# Patient Record
Sex: Female | Born: 1935 | Race: White | Hispanic: No | Smoking: Never smoker
Health system: Southern US, Community
[De-identification: ages and names within clinical notes are randomized; demographics above are authoritative.]

## PROBLEM LIST (undated history)

## (undated) DIAGNOSIS — K5730 Diverticulosis of large intestine without perforation or abscess without bleeding: Secondary | ICD-10-CM

## (undated) DIAGNOSIS — R7303 Prediabetes: Secondary | ICD-10-CM

## (undated) DIAGNOSIS — F329 Major depressive disorder, single episode, unspecified: Secondary | ICD-10-CM

## (undated) DIAGNOSIS — I10 Essential (primary) hypertension: Secondary | ICD-10-CM

## (undated) DIAGNOSIS — R002 Palpitations: Secondary | ICD-10-CM

## (undated) DIAGNOSIS — F32A Depression, unspecified: Secondary | ICD-10-CM

## (undated) DIAGNOSIS — E785 Hyperlipidemia, unspecified: Secondary | ICD-10-CM

## (undated) DIAGNOSIS — I2510 Atherosclerotic heart disease of native coronary artery without angina pectoris: Secondary | ICD-10-CM

## (undated) DIAGNOSIS — K5900 Constipation, unspecified: Secondary | ICD-10-CM

## (undated) DIAGNOSIS — F419 Anxiety disorder, unspecified: Secondary | ICD-10-CM

## (undated) DIAGNOSIS — Z8719 Personal history of other diseases of the digestive system: Secondary | ICD-10-CM

## (undated) DIAGNOSIS — N200 Calculus of kidney: Secondary | ICD-10-CM

## (undated) HISTORY — DX: Personal history of other diseases of the digestive system: Z87.19

## (undated) HISTORY — DX: Depression, unspecified: F32.A

## (undated) HISTORY — DX: Essential (primary) hypertension: I10

## (undated) HISTORY — DX: Atherosclerotic heart disease of native coronary artery without angina pectoris: I25.10

## (undated) HISTORY — DX: Constipation, unspecified: K59.00

## (undated) HISTORY — DX: Major depressive disorder, single episode, unspecified: F32.9

## (undated) HISTORY — DX: Palpitations: R00.2

## (undated) HISTORY — DX: Anxiety disorder, unspecified: F41.9

## (undated) HISTORY — DX: Calculus of kidney: N20.0

## (undated) HISTORY — DX: Diverticulosis of large intestine without perforation or abscess without bleeding: K57.30

## (undated) HISTORY — DX: Hyperlipidemia, unspecified: E78.5

## (undated) HISTORY — DX: Prediabetes: R73.03

---

## 1990-06-20 HISTORY — PX: CARDIAC CATHETERIZATION: SHX172

## 1998-03-19 ENCOUNTER — Emergency Department (HOSPITAL_COMMUNITY): Admission: EM | Admit: 1998-03-19 | Discharge: 1998-03-20 | Payer: Self-pay | Admitting: Emergency Medicine

## 1998-03-20 ENCOUNTER — Encounter: Payer: Self-pay | Admitting: Emergency Medicine

## 1999-03-27 ENCOUNTER — Ambulatory Visit (HOSPITAL_COMMUNITY): Admission: RE | Admit: 1999-03-27 | Discharge: 1999-03-27 | Payer: Self-pay | Admitting: *Deleted

## 1999-03-27 ENCOUNTER — Encounter: Payer: Self-pay | Admitting: *Deleted

## 1999-04-02 ENCOUNTER — Encounter: Payer: Self-pay | Admitting: Neurology

## 1999-04-02 ENCOUNTER — Ambulatory Visit (HOSPITAL_COMMUNITY): Admission: RE | Admit: 1999-04-02 | Discharge: 1999-04-02 | Payer: Self-pay | Admitting: Neurology

## 1999-04-09 ENCOUNTER — Encounter: Payer: Self-pay | Admitting: Family Medicine

## 1999-06-25 ENCOUNTER — Other Ambulatory Visit: Admission: RE | Admit: 1999-06-25 | Discharge: 1999-06-25 | Payer: Self-pay | Admitting: *Deleted

## 1999-07-08 ENCOUNTER — Encounter: Payer: Self-pay | Admitting: *Deleted

## 1999-07-08 ENCOUNTER — Encounter: Admission: RE | Admit: 1999-07-08 | Discharge: 1999-07-08 | Payer: Self-pay | Admitting: Internal Medicine

## 1999-12-14 ENCOUNTER — Encounter: Payer: Self-pay | Admitting: *Deleted

## 1999-12-14 ENCOUNTER — Encounter: Admission: RE | Admit: 1999-12-14 | Discharge: 1999-12-14 | Payer: Self-pay | Admitting: *Deleted

## 1999-12-16 ENCOUNTER — Encounter: Admission: RE | Admit: 1999-12-16 | Discharge: 1999-12-16 | Payer: Self-pay | Admitting: *Deleted

## 1999-12-16 ENCOUNTER — Encounter: Payer: Self-pay | Admitting: *Deleted

## 2000-01-23 ENCOUNTER — Emergency Department (HOSPITAL_COMMUNITY): Admission: EM | Admit: 2000-01-23 | Discharge: 2000-01-23 | Payer: Self-pay | Admitting: Emergency Medicine

## 2000-01-23 ENCOUNTER — Encounter: Payer: Self-pay | Admitting: Emergency Medicine

## 2000-03-29 ENCOUNTER — Encounter: Payer: Self-pay | Admitting: Gastroenterology

## 2000-03-29 ENCOUNTER — Ambulatory Visit (HOSPITAL_COMMUNITY): Admission: RE | Admit: 2000-03-29 | Discharge: 2000-03-29 | Payer: Self-pay | Admitting: Gastroenterology

## 2000-06-30 ENCOUNTER — Encounter: Payer: Self-pay | Admitting: Emergency Medicine

## 2000-06-30 ENCOUNTER — Emergency Department (HOSPITAL_COMMUNITY): Admission: EM | Admit: 2000-06-30 | Discharge: 2000-06-30 | Payer: Self-pay | Admitting: Emergency Medicine

## 2000-09-13 ENCOUNTER — Emergency Department (HOSPITAL_COMMUNITY): Admission: EM | Admit: 2000-09-13 | Discharge: 2000-09-13 | Payer: Self-pay | Admitting: *Deleted

## 2000-09-13 ENCOUNTER — Encounter: Payer: Self-pay | Admitting: Internal Medicine

## 2001-05-23 ENCOUNTER — Other Ambulatory Visit: Admission: RE | Admit: 2001-05-23 | Discharge: 2001-05-23 | Payer: Self-pay | Admitting: *Deleted

## 2001-05-31 ENCOUNTER — Encounter: Admission: RE | Admit: 2001-05-31 | Discharge: 2001-05-31 | Payer: Self-pay | Admitting: *Deleted

## 2001-05-31 ENCOUNTER — Encounter: Payer: Self-pay | Admitting: *Deleted

## 2001-07-06 ENCOUNTER — Encounter: Admission: RE | Admit: 2001-07-06 | Discharge: 2001-07-06 | Payer: Self-pay | Admitting: *Deleted

## 2001-07-06 ENCOUNTER — Encounter: Payer: Self-pay | Admitting: *Deleted

## 2002-06-17 ENCOUNTER — Encounter: Payer: Self-pay | Admitting: Family Medicine

## 2002-06-17 ENCOUNTER — Encounter: Admission: RE | Admit: 2002-06-17 | Discharge: 2002-06-17 | Payer: Self-pay | Admitting: Family Medicine

## 2003-03-11 ENCOUNTER — Emergency Department (HOSPITAL_COMMUNITY): Admission: EM | Admit: 2003-03-11 | Discharge: 2003-03-11 | Payer: Self-pay | Admitting: Emergency Medicine

## 2003-05-13 ENCOUNTER — Encounter: Admission: RE | Admit: 2003-05-13 | Discharge: 2003-06-04 | Payer: Self-pay | Admitting: Family Medicine

## 2005-04-22 ENCOUNTER — Encounter: Admission: RE | Admit: 2005-04-22 | Discharge: 2005-04-22 | Payer: Self-pay | Admitting: Family Medicine

## 2006-05-30 ENCOUNTER — Encounter: Admission: RE | Admit: 2006-05-30 | Discharge: 2006-05-30 | Payer: Self-pay | Admitting: Family Medicine

## 2006-06-20 HISTORY — PX: CARDIOVASCULAR STRESS TEST: SHX262

## 2007-06-06 ENCOUNTER — Encounter: Admission: RE | Admit: 2007-06-06 | Discharge: 2007-06-06 | Payer: Self-pay | Admitting: Family Medicine

## 2007-09-10 ENCOUNTER — Emergency Department (HOSPITAL_COMMUNITY): Admission: EM | Admit: 2007-09-10 | Discharge: 2007-09-10 | Payer: Self-pay | Admitting: Emergency Medicine

## 2008-06-16 ENCOUNTER — Encounter: Admission: RE | Admit: 2008-06-16 | Discharge: 2008-06-16 | Payer: Self-pay | Admitting: Family Medicine

## 2008-09-07 ENCOUNTER — Emergency Department (HOSPITAL_COMMUNITY): Admission: EM | Admit: 2008-09-07 | Discharge: 2008-09-07 | Payer: Self-pay | Admitting: Internal Medicine

## 2008-09-24 ENCOUNTER — Ambulatory Visit: Payer: Self-pay | Admitting: Family Medicine

## 2008-09-24 DIAGNOSIS — I10 Essential (primary) hypertension: Secondary | ICD-10-CM

## 2008-09-24 DIAGNOSIS — R42 Dizziness and giddiness: Secondary | ICD-10-CM

## 2008-09-24 DIAGNOSIS — Z8719 Personal history of other diseases of the digestive system: Secondary | ICD-10-CM

## 2008-09-24 DIAGNOSIS — E785 Hyperlipidemia, unspecified: Secondary | ICD-10-CM

## 2008-09-24 DIAGNOSIS — R5383 Other fatigue: Secondary | ICD-10-CM

## 2008-09-24 DIAGNOSIS — R5381 Other malaise: Secondary | ICD-10-CM

## 2008-09-24 HISTORY — DX: Hyperlipidemia, unspecified: E78.5

## 2008-09-24 HISTORY — DX: Essential (primary) hypertension: I10

## 2008-09-24 HISTORY — DX: Personal history of other diseases of the digestive system: Z87.19

## 2008-09-26 LAB — CONVERTED CEMR LAB
Basophils Absolute: 0 10*3/uL (ref 0.0–0.1)
Calcium: 9.6 mg/dL (ref 8.4–10.5)
Chloride: 106 meq/L (ref 96–112)
Eosinophils Absolute: 0.1 10*3/uL (ref 0.0–0.7)
GFR calc non Af Amer: 85.82 mL/min (ref >60)
Glucose, Bld: 93 mg/dL (ref 70–99)
Hemoglobin: 14.3 g/dL (ref 12.0–15.0)
Lymphs Abs: 2.1 10*3/uL (ref 0.7–4.0)
MCHC: 34.4 g/dL (ref 30.0–36.0)
MCV: 89.3 fL (ref 78.0–100.0)
Neutro Abs: 6.2 10*3/uL (ref 1.4–7.7)
Neutrophils Relative %: 69.4 % (ref 43.0–77.0)
Platelets: 228 10*3/uL (ref 150.0–400.0)
Potassium: 4.2 meq/L (ref 3.5–5.1)
RBC: 4.66 M/uL (ref 3.87–5.11)
RDW: 12.4 % (ref 11.5–14.6)
WBC: 8.8 10*3/uL (ref 4.5–10.5)

## 2008-10-01 ENCOUNTER — Ambulatory Visit: Payer: Self-pay | Admitting: Family Medicine

## 2008-10-04 ENCOUNTER — Emergency Department (HOSPITAL_COMMUNITY): Admission: EM | Admit: 2008-10-04 | Discharge: 2008-10-04 | Payer: Self-pay | Admitting: Emergency Medicine

## 2008-11-03 ENCOUNTER — Telehealth: Payer: Self-pay | Admitting: Family Medicine

## 2008-11-12 ENCOUNTER — Ambulatory Visit: Payer: Self-pay | Admitting: Family Medicine

## 2008-11-28 ENCOUNTER — Encounter: Payer: Self-pay | Admitting: Family Medicine

## 2008-12-12 ENCOUNTER — Encounter: Admission: RE | Admit: 2008-12-12 | Discharge: 2008-12-12 | Payer: Self-pay | Admitting: Orthopedic Surgery

## 2009-06-20 HISTORY — PX: CARDIOVASCULAR STRESS TEST: SHX262

## 2009-11-03 ENCOUNTER — Encounter: Admission: RE | Admit: 2009-11-03 | Discharge: 2009-11-03 | Payer: Self-pay | Admitting: Cardiology

## 2009-12-18 ENCOUNTER — Inpatient Hospital Stay (HOSPITAL_COMMUNITY): Admission: EM | Admit: 2009-12-18 | Discharge: 2009-12-19 | Payer: Self-pay | Admitting: Emergency Medicine

## 2009-12-18 ENCOUNTER — Ambulatory Visit: Payer: Self-pay | Admitting: Internal Medicine

## 2009-12-18 ENCOUNTER — Telehealth: Payer: Self-pay | Admitting: Gastroenterology

## 2009-12-18 LAB — CONVERTED CEMR LAB
Basophils Absolute: 0 10*3/uL (ref 0.0–0.1)
Eosinophils Absolute: 0.1 10*3/uL (ref 0.0–0.7)
Lymphs Abs: 2.4 10*3/uL (ref 0.7–4.0)
MCHC: 33.9 g/dL (ref 30.0–36.0)
Neutro Abs: 5 10*3/uL (ref 1.4–7.7)
Neutrophils Relative %: 62 % (ref 43.0–77.0)
RBC: 4.8 M/uL (ref 3.87–5.11)

## 2009-12-19 ENCOUNTER — Ambulatory Visit: Payer: Self-pay | Admitting: Internal Medicine

## 2009-12-24 ENCOUNTER — Ambulatory Visit: Payer: Self-pay | Admitting: Family Medicine

## 2010-01-26 ENCOUNTER — Ambulatory Visit: Payer: Self-pay | Admitting: Cardiology

## 2010-01-26 ENCOUNTER — Encounter: Payer: Self-pay | Admitting: Family Medicine

## 2010-01-27 ENCOUNTER — Ambulatory Visit: Payer: Self-pay | Admitting: Cardiology

## 2010-01-29 ENCOUNTER — Ambulatory Visit: Payer: Self-pay | Admitting: Family Medicine

## 2010-01-29 DIAGNOSIS — K5900 Constipation, unspecified: Secondary | ICD-10-CM

## 2010-01-29 HISTORY — DX: Constipation, unspecified: K59.00

## 2010-02-02 ENCOUNTER — Encounter: Payer: Self-pay | Admitting: Internal Medicine

## 2010-03-15 ENCOUNTER — Ambulatory Visit: Payer: Self-pay | Admitting: Family Medicine

## 2010-03-23 ENCOUNTER — Encounter (INDEPENDENT_AMBULATORY_CARE_PROVIDER_SITE_OTHER): Payer: Self-pay | Admitting: *Deleted

## 2010-03-23 ENCOUNTER — Ambulatory Visit: Payer: Self-pay | Admitting: Internal Medicine

## 2010-03-23 DIAGNOSIS — K5730 Diverticulosis of large intestine without perforation or abscess without bleeding: Secondary | ICD-10-CM

## 2010-03-23 DIAGNOSIS — K649 Unspecified hemorrhoids: Secondary | ICD-10-CM

## 2010-03-23 HISTORY — DX: Diverticulosis of large intestine without perforation or abscess without bleeding: K57.30

## 2010-04-12 ENCOUNTER — Ambulatory Visit: Payer: Self-pay | Admitting: Family Medicine

## 2010-04-12 ENCOUNTER — Encounter: Payer: Self-pay | Admitting: Family Medicine

## 2010-04-12 DIAGNOSIS — R0789 Other chest pain: Secondary | ICD-10-CM | POA: Insufficient documentation

## 2010-04-14 ENCOUNTER — Inpatient Hospital Stay (HOSPITAL_COMMUNITY): Admission: EM | Admit: 2010-04-14 | Discharge: 2010-04-15 | Payer: Self-pay | Admitting: Emergency Medicine

## 2010-04-14 ENCOUNTER — Ambulatory Visit: Payer: Self-pay | Admitting: Cardiology

## 2010-04-26 ENCOUNTER — Ambulatory Visit: Payer: Self-pay | Admitting: Family Medicine

## 2010-04-30 ENCOUNTER — Ambulatory Visit: Payer: Self-pay | Admitting: Cardiology

## 2010-07-20 NOTE — Assessment & Plan Note (Signed)
Summary: pt not feeling well/njr   Vital Signs:  Patient profile:   77 year old female Weight:      155 pounds Temp:     97.9 degrees F oral BP sitting:   130 / 80  (left arm) Cuff size:   regular  Vitals Entered By: Duard Brady LPN (January 29, 2010 11:48 AM) CC: c/o 'mot feeling well all over' - c/o sinus congestion, and contipation Is Patient Diabetic? No   History of Present Illness: Pt here with chief compliant of constipation. 2 weeks of intermittent hard stools.  No further bleeding-since recent hematochezia prob sec to internal hemorrhoids.  Admits that she does not always drink alot of fluids. Generalized malaise.  Appetite fair.  Slight nausea but no vomiting.  Recent hematochezia with int hemorrhoids.  Diverticulosis noted. Pt has never had colonoscopy. She would like to consider at this time.  We have reassured her that recent bleeding likely sec to hemorrhoids.  Allergies: 1)  Penicillin V Potassium (Penicillin V Potassium)  Past History:  Past Medical History: Last updated: 09/24/2008 Diverticulitis, hx of Hyperlipidemia Hypertension kidney stones migraines diabebes, borderline PMH reviewed for relevance  Review of Systems  The patient denies anorexia, fever, weight loss, abdominal pain, melena, hematochezia, and severe indigestion/heartburn.    Physical Exam  General:  Well-developed,well-nourished,in no acute distress; alert,appropriate and cooperative throughout examination Lungs:  Normal respiratory effort, chest expands symmetrically. Lungs are clear to auscultation, no crackles or wheezes. Heart:  normal rate and regular rhythm.   Abdomen:  soft, non-tender, normal bowel sounds, no distention, no masses, no guarding, no rigidity, no hepatomegaly, and no splenomegaly.     Impression & Recommendations:  Problem # 1:  CONSTIPATION (ICD-564.00)  discussion with pt.  She admits she has some anxiety regarding her recent hematochezia.  She  realizes that her bleeding was very likey sec to int hemorroid bleed but requests referral for screeing/diagnostic colonoscopy.  She  does have some constipation which is relatively new.  Orders: Gastroenterology Referral (GI)  Complete Medication List: 1)  Tranxene-t 7.5 Mg Tabs (Clorazepate dipotassium) .... 1/2 tab in am, 1/2 tab in pm, 1/4 tab prn 2)  Anucort-hc 25 Mg Supp (Hydrocortisone acetate) .Marland Kitchen.. 1 per rectum once daily prn 3)  Fluticasone Propionate 50 Mcg/act Susp (Fluticasone propionate) .... 2 sprays per nostril once daily  Patient Instructions: 1)  Drink plenty of fluids 2)  Get plenty of fiber and consider supplement such as Citracel or Metamucil. 3)  Walk regularly. 4)  Consider stool softener such as Colace. 5)  We will set up colonoscopy

## 2010-07-20 NOTE — Assessment & Plan Note (Signed)
Summary: [name]   Vital Signs:  Patient profile:   77 year old female Weight:      161 pounds Temp:     98.8 degrees F oral BP sitting:   150 / 74  (left arm) Cuff size:   regular  Vitals Entered By: [name] LPN (April 26, 2010 1:11 PM)  Serial Vital Signs/Assessments:  Time      Position  BP       Pulse  Resp  Temp     By                     130/70                         Lynn Peat MD   History of Present Illness: History or follow recent hospitalization for atypical chest pain. Ruled out for MI. Nuclear stress test unremarkable. Patient started back on bisoprolol 5 mg daily and tolerating well. She had palpitations at time of admission. Thyroid function normal. Hemoglobin A1c 6.1%. Patient denies any significant chest pain since discharge. Her only complaint is some sleepiness and fatigue since starting bisoprolol.  No further significant palpitations since discharge.  Hosp D/C summary reviewed. She has long hx of anxiety and has taken low dose Tranxene for years.  Hypertension History:      She denies headache, palpitations, dyspnea with exertion, orthopnea, peripheral edema, visual symptoms, neurologic problems, and syncope.  She notes the following problems with antihypertensive medication side effects: ?mild drowsiness.        Positive major cardiovascular risk factors include female age 3 years old or older, hyperlipidemia, and hypertension.  Negative major cardiovascular risk factors include non-tobacco-user status.     Allergies: 1)  Penicillin V Potassium (Penicillin V Potassium)  Past History:  Past Medical History: Last updated: 03/23/2010 Diverticulitis Hyperlipidemia Hypertension kidney stones migraines diabebes, borderline Anxiety Disorder  Past Surgical History: Last updated: 03/23/2010 Unremarkable  Family History: Last updated: 03/23/2010 Family History Hypertension: sisters, mother Diabetes: mother Father Gastric  tumor.  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    Physical Exam  General:  Well-developed,well-nourished,in no acute distress; alert,appropriate and cooperative throughout examination Neck:  No deformities, masses, or tenderness noted. Lungs:  Normal respiratory effort, chest expands symmetrically. Lungs are clear to auscultation, no crackles or wheezes. Heart:  normal rate and regular rhythm.   Extremities:  no edema. Neurologic:  alert & oriented X3 and cranial nerves II-XII intact.   Skin:  no rashes and no suspicious lesions.   Cervical Nodes:  No lymphadenopathy noted Psych:  normally interactive, good eye contact, not depressed appearing, and slightly anxious.     Impression & Recommendations:  Problem # 1:  CHEST PAIN, ATYPICAL (ICD-786.59) Assessment Improved recent nuclear stress unremarkable.  Problem # 2:  HYPERTENSION (ICD-401.9)  Her updated medication list for this problem includes:    Bisoprolol Fumarate 5 Mg Tabs (Bisoprolol fumarate) ..... Once daily  Complete Medication List: 1)  Tranxene-t 7.5 Mg Tabs (Clorazepate dipotassium) .... 1/2 tab in am, 1/2 tab in pm, 1/4 tab prn 2)  Benefiber  Pack (Guar gum) .... Mix 2 teaspoons in water and drink two times a day 3)  Aspirin 81 Mg Tabs (Aspirin) .... Once daily 4)  Bisoprolol Fumarate 5 Mg Tabs (Bisoprolol fumarate) .... Once daily  Hypertension Assessment/Plan:      The patient's hypertensive risk group is category B: At least one risk factor (excluding diabetes) with no  target organ damage.  Today's blood pressure is 150/74.    Patient Instructions: 1)  Please schedule a follow-up appointment in 3 months .  2)  Check your  Blood Pressure regularly . If it is above:140/90   you should make an appointment.   Orders Added: 1)  Est. Patient Level IV [04540]

## 2010-07-20 NOTE — Assessment & Plan Note (Signed)
Summary: rectal bleeding/per nancy/cjr   Vital Signs:  Patient profile:   77 year old female Weight:      155 pounds Temp:     98 degrees F oral BP sitting:   150 / 80  (left arm) BP standing:   142 / 80 Cuff size:   regular  Vitals Entered By: [name] LPN (December 18, 1608 10:27 AM)  Serial Vital Signs/Assessments:  Time      Position  BP       Pulse  Resp  Temp     By           Standing  142/80                         Evelena Peat MD   History of Present Illness: Work in . Onset this AM bright blood per rectum after BM. Occ constipation recently.  No diarrhea.  Mild diffuse lower abd cramping past couple of days. No appetite or weight change reported.  Remote hx of BE several years ago but no prior colonoscopy.  No fever or chills. No orthostatic changes reported.  Did note some mild "light headedness" leaving the office. Does use aspirin.  Allergies: 1)  Penicillin V Potassium (Penicillin V Potassium)  Past History:  Past Medical History: Last updated: 09/24/2008 Diverticulitis, hx of Hyperlipidemia Hypertension kidney stones migraines diabebes, borderline  Family History: Last updated: 12/18/2009 Family History Hypertension Diabetes Father Gastric tumor.  Social History: Last updated: 09/24/2008 Retired Widow/Widower Never Smoked Alcohol use-no  Risk Factors: Smoking Status: never (09/24/2008) PMH-FH-SH reviewed for relevance  Family History: Family History Hypertension Diabetes Father Gastric tumor.  Review of Systems       The patient complains of hematochezia.  The patient denies anorexia, fever, weight loss, melena, and severe indigestion/heartburn.    Physical Exam  General:  Well-developed,well-nourished,in no acute distress; alert,appropriate and cooperative throughout examination Mouth:  Oral mucosa and oropharynx without lesions or exudates.  Teeth in good repair. Lungs:  Normal respiratory effort, chest expands  symmetrically. Lungs are clear to auscultation, no crackles or wheezes. Heart:  normal rate and regular rhythm.   Abdomen:  soft, non-tender, normal bowel sounds, no distention, no masses, no guarding, no rigidity, no hepatomegaly, and no splenomegaly.   Rectal:  no external hemorroids.  No fissures.digital no mass.  Anoscopy maroon colored blood noted.  No obvious hemorroid source of bleeding. Does have some int hemorrhoids but could not identify any obvious source of bleeding. Extremities:  no edema. Psych:  normally interactive, good eye contact, and slightly anxious.     Impression & Recommendations:  Problem # 1:  HEMATOCHEZIA (ICD-578.1) Assessment New ?diverticulosis, ?AVM vs other.  Obtain CBC and consult with GI.  Pt hemodynamically stable. Orders: Venipuncture (96045) Gastroenterology Referral (GI) TLB-CBC Platelet - w/Differential ([id number])  Complete Medication List: 1)  Tranxene-t 7.5 Mg Tabs (Clorazepate dipotassium) .... 1/2 tab in am, 1/2 tab in pm, 1/4 tab prn 2)  Aspirin Adult Low Strength 81 Mg Tbec (Aspirin) .... Qd 3)  Benicar 20 Mg Tabs (Olmesartan medoxomil) .... One by mouth once daily 4)  Rhinocort Aqua 32 Mcg/act Susp (Budesonide) .... One spray to each nostril daily  Patient Instructions: 1)  Avoid any further use of aspirin at this time. 2)  Follow up immediately or go to ER if you have any extreme dizziness, severe bleeding, or severe abdominal pain.

## 2010-07-20 NOTE — Procedures (Signed)
Summary: Flexible Sigmoidoscopy  Patient: Lynn Walters Note: All result statuses are Final unless otherwise noted.  Tests: (1) Flexible Sigmoidoscopy (FLX)  FLX Flexible Sigmoidoscopy                             DONE     [facility]     [address]     [place], [place]  [address]           FLEXIBLE SIGMOIDOSCOPY PROCEDURE REPORT           PATIENT:  Lynn Walters, Lynn Walters  MR#:  [id number]     BIRTHDATE:  1928/05/14, 82 yrs. old  GENDER:  female           ENDOSCOPIST:  Iva Boop, MD, [facility]     Referred by:  Evelena Peat, M.D.           PROCEDURE DATE:  12/19/2009     PROCEDURE:  Flexible Sigmoidoscopy, diagnostic     ASA CLASS:  Class III     INDICATIONS:  rectal bleeding, Hgb has been normalx 3 over 12-24     hrs, brown stool tinged with blood this AM           MEDICATIONS:   none           DESCRIPTION OF PROCEDURE:   After the risks benefits and     alternatives of the procedure were thoroughly explained, informed     consent was obtained.  Digital rectal exam was performed and     revealed no abnormalities.   The [id number] ([id number]) endoscope was     introduced through the anus and advanced to the descending colon,     without limitations.  The quality of the prep was adequate.  The     instrument was then slowly withdrawn as the mucosa was fully     examined.     <<PROCEDUREIMAGES>>           Severe diverticulosis was found in the sigmoid colon.  The     examination was otherwise normal.   No blood seen anywhere.     Retroflexed views in the rectum revealed medium hemorrhoids.     Internal hemorrhoids.  The scope was then withdrawn from the     patient and the procedure terminated.           COMPLICATIONS:  None           ENDOSCOPIC IMPRESSION:     1) Medium internal hemorrhoids - scenario is consistent with     bleeding from these.     2) Severe diverticulosis in the sigmoid colon     3) Otherwise normal examination.      RECOMMENDATIONS:     Hydrocortisone 25 mg suppository nightly x 7 nights then as     needed.     Release to home today     Follow-up with Dr. Caryl Never to reassess within 2 weeks of     discharge.     GI follow-up as needed.           Iva Boop, MD, [facility]           CC:  Evelena Peat, MD and The Patient           n.     eSIGNED:   Iva Boop at 12/19/2009 10:31 AM  Earlean Polka, [id number]  Note: An exclamation mark (!) indicates a result that was not dispersed into the flowsheet. Document Creation Date: 12/19/2009 10:32 AM _______________________________________________________________________  (1) Order result status: Final Collection or observation date-time: 12/19/2009 10:24 Requested date-time:  Receipt date-time:  Reported date-time:  Referring Physician:   Ordering Physician: Stan Head [phone]) Specimen Source:  Source: Launa Grill Order Number: [id number] Lab site:   Appended Document: Flexible Sigmoidoscopy   Flexible Sigmoidoscopy  Procedure date:  12/19/2009  Findings:      ENDOSCOPIC IMPRESSION:     1) Medium internal hemorrhoids - scenario is consistent with     bleeding from these.     2) Severe diverticulosis in the sigmoid colon     3) Otherwise normal examination.

## 2010-07-20 NOTE — Assessment & Plan Note (Signed)
Summary: pulled muscle chest?/dm   Vital Signs:  Patient profile:   77 year old female Weight:      160 pounds Temp:     98.1 degrees F oral BP sitting:   150 / 86  (left arm) Cuff size:   regular  Vitals Entered By: [name] LPN (April 12, 2010 2:56 PM)  History of Present Illness: onset few days ago.  Pain off and on.  Mild severity.  Sore to touch.         This is an 77 year old woman who presents with Chest Pain.  The patient reports resting chest pain, nausea, vomiting, and palpitations, but denies exertional chest pain, dizziness, and syncope.  The pain is described as intermittent, heavy, and pressure-like.  The pain is located in the substernal area.  The pain radiates to the back.  Episodes of chest pain last >30 minutes.  The pain is brought on or made worse by upper body movement.  No alleviating factors.  She has not tried any Advil or Alleve.  She is somewhat of a poor historian but symptoms are prolonged lasting several hours at times but she states mild in severity.  Has seen cardiology in past and has had stress test (no ischemia) .  Has appt with cardiology Nov 11. No active GERD symptoms.         Allergies: 1)  Penicillin V Potassium (Penicillin V Potassium)  Past History:  Past Medical History: Last updated: 03/23/2010 Diverticulitis Hyperlipidemia Hypertension kidney stones migraines diabebes, borderline Anxiety Disorder  Past Surgical History: Last updated: 03/23/2010 Unremarkable  Family History: Last updated: 03/23/2010 Family History Hypertension: sisters, mother Diabetes: mother Father Gastric tumor.  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    Physical Exam  General:  Well-developed,well-nourished,in no acute distress; alert,appropriate and cooperative throughout examination Mouth:  Oral mucosa and oropharynx without lesions or exudates.  Teeth in good repair. Neck:  No deformities, masses, or tenderness noted. Chest Wall:  tender L sternal border. Lungs:  Normal respiratory effort, chest expands symmetrically. Lungs are clear to auscultation, no crackles or wheezes. Heart:  Normal rate and regular rhythm. S1 and S2 normal without gallop, murmur, click, rub or other extra sounds. Abdomen:  soft, non-tender, no distention, no masses, no hepatomegaly, and no splenomegaly.   Skin:  no rashes and no suspicious lesions.   Cervical Nodes:  No lymphadenopathy noted Psych:  normally interactive, good eye contact, and slightly anxious.     Impression & Recommendations:  Problem # 1:  CHEST PAIN, ATYPICAL (ICD-786.59) Assessment New  EKG no acute changes.   Pt does have some atypical features with prolonged symptoms, soreness to touch, and worse with movement of upper body.  Keep f/u with cardiology early Nov. and follow up sooner if any progressive symptoms. She will try OTC NSAID.  Orders: EKG w/ Interpretation (93000)  Complete Medication List: 1)  Tranxene-t 7.5 Mg Tabs (Clorazepate dipotassium) .... 1/2 tab in am, 1/2 tab in pm, 1/4 tab prn 2)  Benefiber Pack (Guar gum) .... Mix 2 teaspoons in water and drink two times a day 3)  Moviprep 100 Gm Solr (  Peg-kcl-nacl-nasulf-na asc-c) .... As per prep instructions.  Patient Instructions: 1)  Follow up with Dr Patty Sermons as scheduled.   2)  Follow up sooner if you have any exertional chest pain or worsening chest pain.   Orders Added: 1)  Est. Patient Level IV [16109] 2)  EKG w/ Interpretation [93000]

## 2010-07-20 NOTE — Progress Notes (Signed)
Summary: triage/rectal bleeding  Phone Note From Other Clinic Call back at [phone]   Caller: Referral Coordinator Terri Call For: doc of the day Reason for Call: Schedule Patient Appt Summary of Call: Dr Caryl Never wants this patient seen today for brb from rectum and mild abd pain. Initial call taken by: Tawni Levy,  December 18, 2009 11:11 AM  Follow-up for Phone Call        Discussed with Dr Christella Hartigan afternoon DOD.  Camelia Eng is notified to have the patient here at 1:00 for paperwork and asked to have CBC run stat and ask Dr Caryl Never for finish his note from this am.   Follow-up by: Darcey Nora RN, CGRN,  December 18, 2009 11:33 AM     Appended Document: triage/rectal bleeding Per Dr Christella Hartigan patient to go to ER rather than afternoon office visit due to [facility] colored stool on anoscopy.  Camelia Eng will notify the patient

## 2010-07-20 NOTE — Assessment & Plan Note (Signed)
Summary: consult re: meds/cjr   Vital Signs:  Patient profile:   77 year old female Weight:      155 pounds Temp:     98.2 degrees F oral BP sitting:   124 / 80  (left arm) Cuff size:   regular  Vitals Entered By: Duard Brady LPN (December 24, 3472 1:34 PM) CC: medication question following [place] for hemorrhoid bleeding Is Patient Diabetic? No   History of Present Illness: Patient seen recent hospital followup. She presented here with bright red blood per rectum and some dizziness. She had noted internal hemorrhoids but we could not verify that as source of bleeding. Patient was admitted and hemoglobin remained stable. She had flexible sigmoidoscopy and had findings of diverticulosis but bleeding was felt to be coming from hemorrhoids. She was placed on hydrocortisone suppository and at this point no further bleeding.  Denies any orthostatic type symptoms. She does have some dizziness and describes some right maxillary and right frontal sinus pressure. Seeing ENT surgeon recently and placed on 2 different antibiotics with doxycycline and Zithromax without much improvement. Still has some sinus pressure. Reluctant to take any oral medications. No purulent discharge. No fever.  Patient does not take any further aspirin. Denies abdominal pain. Fair appetite. BP has been stable on Benicar.  Allergies: 1)  Penicillin V Potassium (Penicillin V Potassium)  Past History:  Past Medical History: Last updated: 09/24/2008 Diverticulitis, hx of Hyperlipidemia Hypertension kidney stones migraines diabebes, borderline  Family History: Last updated: 12/18/2009 Family History Hypertension Diabetes Father Gastric tumor.  Social History: Last updated: 09/24/2008 Retired Widow/Widower Never Smoked Alcohol use-no  Review of Systems  The patient denies anorexia, fever, weight loss, chest pain, abdominal pain, melena, and muscle weakness.    Physical Exam  General:   Well-developed,well-nourished,in no acute distress; alert,appropriate and cooperative throughout examination Ears:  External ear exam shows no significant lesions or deformities.  Otoscopic examination reveals clear canals, tympanic membranes are intact bilaterally without bulging, retraction, inflammation or discharge. Hearing is grossly normal bilaterally. Nose:  External nasal examination shows no deformity or inflammation. Nasal mucosa are pink and moist without lesions or exudates. Mouth:  Oral mucosa and oropharynx without lesions or exudates.  Teeth in good repair. Neck:  No deformities, masses, or tenderness noted. Lungs:  Normal respiratory effort, chest expands symmetrically. Lungs are clear to auscultation, no crackles or wheezes. Heart:  normal rate and regular rhythm.   Abdomen:  soft and non-tender.     Impression & Recommendations:  Problem # 1:  HEMATOCHEZIA (ICD-578.1) recent likely internal hemorrhoid bleed. Improved. Long discussion regarding colonoscopy. Even though she is 34 she is otherwise in excellent health. At this point she like to wait at least a month and she'll call back if she has further interest in scheduling this  Problem # 2:  DIZZINESS (ICD-780.4) Assessment: New  symptoms are nonspecific and possibly sinus related. Trial of fluticasone nasal  Orders: Prescription Created Electronically [phone])  Problem # 3:  HYPERTENSION (ICD-401.9)  The following medications were removed from the medication list:    Benicar 20 Mg Tabs (Olmesartan medoxomil) ..... One by mouth once daily  Complete Medication List: 1)  Tranxene-t 7.5 Mg Tabs (Clorazepate dipotassium) .... 1/2 tab in am, 1/2 tab in pm, 1/4 tab prn 2)  Aspirin Adult Low Strength 81 Mg Tbec (Aspirin) .... Qd 3)  Anucort-hc 25 Mg Supp (Hydrocortisone acetate) .Marland Kitchen.. 1 per rectum once daily prn 4)  Fluticasone Propionate 50 Mcg/act Susp (Fluticasone  propionate) .... 2 sprays per nostril once  daily  Patient Instructions: 1)  Please schedule a follow-up appointment in 2 months.  2)  Continue to hold aspirin. Prescriptions: FLUTICASONE PROPIONATE 50 MCG/ACT SUSP (FLUTICASONE PROPIONATE) 2 sprays per nostril once daily  #1 x 6   Entered and Authorized by:   Evelena Peat MD   Signed by:   Evelena Peat MD on 12/24/2009   Method used:   Electronically to        [facility] # [id number]* (retail)       [address]       [place], [place]  [address]       Ph: [phone]       Fax: [phone]   RxID:   [id number]

## 2010-07-20 NOTE — Letter (Signed)
Summary: [facility] Instructions  [facility]  [address] [place], [place] [address]   Phone: [phone]  Fax: [phone]       Lynn Walters    01/27/1928    MRN: [id number]      Procedure Day Dorna BloomLulu Riding, 05/12/10     Arrival Time: 8:00 AM       Procedure Time: 9:00 AM    Location of Procedure:                    _X_  [facility] (4th Floor)  PREPARATION FOR COLONOSCOPY WITH MOVIPREP   Starting 5 days prior to your procedure 05/07/10 do not eat nuts, seeds, popcorn, corn, beans, peas,  salads, or any raw vegetables.  Do not take any fiber supplements (e.g. Metamucil, Citrucel, and Benefiber).  THE DAY BEFORE YOUR PROCEDURE         TUESDAY, 05/11/10  1.  Drink clear liquids the entire day-NO SOLID FOOD  2.  Do not drink anything colored red or purple.  Avoid juices with pulp.  No orange juice.  3.  Drink at least 64 oz. (8 glasses) of fluid/clear liquids during the day to prevent dehydration and help the prep work efficiently.  CLEAR LIQUIDS INCLUDE: Water Jello Ice Popsicles Tea (sugar ok, no milk/cream) Powdered fruit flavored drinks Coffee (sugar ok, no milk/cream) Gatorade Juice: apple, white grape, white cranberry  Lemonade Clear bullion, consomm, broth Carbonated beverages (any kind) Strained chicken noodle soup Hard Candy                           4.  In the morning, mix first dose of MoviPrep solution:    Empty 1 Pouch A and 1 Pouch B into the disposable container    Add lukewarm drinking water to the top line of the container. Mix to dissolve    Refrigerate (mixed solution should be used within 24 hrs)  5.  Begin drinking the prep at 5:00 p.m. The MoviPrep container is divided by 4 marks.   Every 15 minutes drink the solution down to the next mark (approximately 8 oz) until the full liter is complete.   6.  Follow completed prep with 16 oz of clear liquid of your choice (Nothing red or purple).  Continue to drink  clear liquids until bedtime.  7.  Mix second dose of MoviPrep solution:    Empty 1 Pouch A and 1 Pouch B into the disposable container    Add lukewarm drinking water to the top line of the container. Mix to dissolve    Refrigerate  Beginning at 9:00 p.m.:         1. Every 15 minutes, drink the solution down to the next mark (approx 8 oz) until the full liter is complete.         2. Follow completed prep with 16 oz. of clear liquid of your choice.    THE DAY OF YOUR PROCEDURE      WEDNESDAY, 05/12/10  1. You may drink clear liquids until 7:00 AM (2 HOURS BEFORE PROCEDURE).  MEDICATION INSTRUCTIONS  Unless otherwise instructed, you should take regular prescription medications with a small sip of water   as early as possible the morning of your procedure.       OTHER INSTRUCTIONS  You will need a responsible adult at least 75 years of age to accompany you and drive you home.   This  person must remain in the waiting room during your procedure.  Wear loose fitting clothing that is easily removed.  Leave jewelry and other valuables at home.  However, you may wish to bring a book to read or  an iPod/MP3 player to listen to music as you wait for your procedure to start.  Remove all body piercing jewelry and leave at home.  Total time from sign-in until discharge is approximately 2-3 hours.  You should go home directly after your procedure and rest.  You can resume normal activities the  day after your procedure.  The day of your procedure you should not:   Drive   Make legal decisions   Operate machinery   Drink alcohol   Return to work  You will receive specific instructions about eating, activities and medications before you leave.   The above instructions have been reviewed and explained to me by   Francee Piccolo, CMA (AAMA)    I fully understand and can verbalize these instructions _____________________________ Date 03/23/10

## 2010-07-20 NOTE — Assessment & Plan Note (Signed)
Summary: CONSTIPATION/YF   History of Present Illness Visit Type: Initial Consult Primary GI MD: Stan Head MD [facility] Primary Provider: Evelena Peat, MD Requesting Provider: Evelena Peat, MD Chief Complaint: constipation History of Present Illness:   Patient referred for constipation, she feels that she has been doing better since starting Benefiber two times a day. She does complain that her stools are very thin and dark brown in color. She denies any blood in her stool recently but does complain of hemorrhoids.   She had rectal bleeding and was in hospital briefly in July, I had found hemorrhoids and diverticulosis on flex sig.  The problems described above appear to be fairly chronic.   GI Review of Systems    Reports bloating and  loss of appetite.      Denies abdominal pain, acid reflux, belching, chest pain, dysphagia with liquids, dysphagia with solids, heartburn, nausea, vomiting, vomiting blood, weight loss, and  weight gain.      Reports black tarry stools, change in bowel habits, constipation, diverticulosis, and  hemorrhoids.     Denies anal fissure, diarrhea, fecal incontinence, heme positive stool, irritable bowel syndrome, jaundice, light color stool, liver problems, rectal bleeding, and  rectal pain. Preventive Screening-Counseling & Management      Drug Use:  no.      Clinical Reports Reviewed:  Flexible Sigmoidoscopy:  12/19/2009:  DONE  12/19/2009:  ENDOSCOPIC IMPRESSION:     1) Medium internal hemorrhoids - scenario is consistent with     bleeding from these.     2) Severe diverticulosis in the sigmoid colon     3) Otherwise normal examination.   Current Medications (verified): 1)  Tranxene-T 7.5 Mg Tabs (Clorazepate Dipotassium) .... 1/2 Tab in Am, 1/2 Tab in Pm, 1/4 Tab Prn 2)  Benefiber  Pack (Guar Gum) .... Mix 2 Teaspoons in Water and Drink Two Times A Day  Allergies (verified): 1)  Penicillin V Potassium (Penicillin V Potassium)  Past  History:  Past Medical History: Reviewed history from 03/23/2010 and no changes required. Diverticulitis Hyperlipidemia Hypertension kidney stones migraines diabebes, borderline Anxiety Disorder  Past Surgical History: Unremarkable  Family History: Family History Hypertension: sisters, mother Diabetes: mother Father Gastric tumor.  Social History: Retired Armed forces training and education officer Never Smoked Alcohol use-no widowed one daughter  Daily Caffeine Use 1 per day Illicit Drug Use - no Drug Use:  no  Review of Systems       The patient complains of allergy/sinus, anxiety-new, arthritis/joint pain, back pain, confusion, depression-new, fatigue, itching, and muscle pains/cramps.    Vital Signs:  Patient profile:   77 year old female Height:      65 inches Weight:      160.4 pounds BMI:     26.79 Pulse rate:   84 / minute Pulse rhythm:   regular BP sitting:   144 / 78  (left arm) Cuff size:   regular  Vitals Entered By: Harlow Mares CMA Duncan Dull) (March 23, 2010 2:19 PM)  Physical Exam  General:  Well developed, well nourished, no acute distress. Looks younger. Hispanic female. Lungs:  Clear throughout to auscultation. Heart:  Regular rate and rhythm; no murmurs, rubs,  or bruits. Abdomen:  mildly tender in upper abdomen BS+ no masses, soft, no HSM Extremities:  mo edema Psych:  Alert and cooperative. Normal mood and affect.   Impression & Recommendations:  Problem # 1:  CONSTIPATION (ICD-564.00) Assessment Improved Sounds like she is moving her bowels well on benefiber. Would not change that.  This is a chronic problem and quite likely due to diverticulosis.  Problem # 2:  DIVERTICULOSIS OF COLON (ICD-562.10) Assessment: Unchanged Severe, likely cause of constipation.  Problem # 3:  HEMORRHOIDS, WITH BLEEDING (ICD-455.8) Assessment: Unchanged Occasional bleeding. These were diagnosed by flex sig in July.  Problem # 4:  SCREENING, COLON CANCER (ICD-V76.51) Assessment:  New Risks, benefits,and indications of endoscopic procedure(s) were reviewed with the patient and all questions answered. Since above problems are stable to improved and chronic, she is really a screening patient re: colonoscopy.  Patient Instructions: 1)  Please pick up your medications at your pharmacy.  2)  We will see you at your procedure on 05/12/10. 3)  [facility] Patient Information Guide given to patient.  4)  Colonoscopy and Flexible Sigmoidoscopy brochure given.  5)  Copy sent to : Clint Guy, MD 6)  The medication list was reviewed and reconciled.  All changed / newly prescribed medications were explained.  A complete medication list was provided to the patient / caregiver. Prescriptions: MOVIPREP 100 GM  SOLR (PEG-KCL-NACL-NASULF-NA ASC-C) As per prep instructions.  #1 x 0   Entered by:   Francee Piccolo CMA (AAMA)   Authorized by:   Iva Boop MD, [facility]   Signed by:   Francee Piccolo CMA (AAMA) on 03/23/2010   Method used:   Electronically to        [facility] [phone]* (retail)       [address]       [place], [place]  [address]       Ph: [phone]       Fax: [phone]   RxID:   [id number]   Appended Document: Orders Update Clinical Lists Changes  Orders: Added new Test order of Colonoscopy (Colon) - Signed

## 2010-07-20 NOTE — Assessment & Plan Note (Signed)
Summary: body pains//ccm   Vital Signs:  Patient profile:   77 year old female Weight:      159 pounds Temp:     98.3 degrees F oral BP sitting:   140 / 80  (left arm) Cuff size:   regular  Vitals Entered By: [name] LPN (March 15, 2010 3:01 PM)  History of Present Illness: Patient seen with 2-3 history left knee pain. No specific injury but possibly worse after getting out of car. Pain is worse at night. Dull achy pain worse medial aspect knee. No locking or giving way. No bruising. No effusion. She does not take any medications.  moderate severity.  She has some bilateral diffuse back pains which are worse after prolonged periods of standing. No localizing pain. No radiculopathy symptoms. Onset last week.  No alleviating factors.  No associated  pleuritic pain, cough, or dypsnea.  Patient needs flu vaccine and has taken in the past without difficulty  Hx elev BP with prob white coat syndrome.  BP stable by home readings.  Allergies: 1)  Penicillin V Potassium (Penicillin V Potassium)  Past History:  Past Medical History: Last updated: 09/24/2008 Diverticulitis, hx of Hyperlipidemia Hypertension kidney stones migraines diabebes, borderline  Social History: Last updated: 09/24/2008 Retired Widow/Widower Never Smoked Alcohol use-no PMH reviewed for relevance, SH/Risk Factors reviewed for relevance  Review of Systems  The patient denies anorexia, fever, weight loss, chest pain, syncope, dyspnea on exertion, peripheral edema, prolonged cough, hemoptysis, abdominal pain, suspicious skin lesions, and difficulty walking.    Physical Exam  General:  Well-developed,well-nourished,in no acute distress; alert,appropriate and cooperative throughout examination Mouth:  Oral mucosa and oropharynx without lesions or exudates.  Teeth in good repair. Neck:  No deformities, masses, or tenderness noted. Lungs:  Normal respiratory effort, chest expands symmetrically.  Lungs are clear to auscultation, no crackles or wheezes. Heart:  Normal rate and regular rhythm. S1 and S2 normal without gallop, murmur, click, rub or other extra sounds. Abdomen:  soft and non-tender.   Msk:  No deformity or scoliosis noted of thoracic or lumbar spine.  Slighlty tender R thoracic area without rash. Extremities:  left knee full range of motion. Minimal crepitus. No effusion. Mild medial joint line tenderness.   Impression & Recommendations:  Problem # 1:  KNEE PAIN (GNF-621.30) Assessment New Suspect osteoarthritis.  trial Celebrex 200 mg by mouth once daily.  Problem # 2:  BACK PAIN (ICD-724.5) Assessment: New Suspect muscular.  Celebrex as above.  Problem # 3:  Preventive Health Care (ICD-V70.0) flu vaccine given.  Problem # 4:  HYPERTENSION (ICD-401.9) cont to monitor.    Complete Medication List: 1)  Tranxene-t 7.5 Mg Tabs (Clorazepate dipotassium) .... 1/2 tab in am, 1/2 tab in pm, 1/4 tab prn 2)  Anucort-hc 25 Mg Supp (Hydrocortisone acetate) .Marland Kitchen.. 1 per rectum once daily prn 3)  Fluticasone Propionate 50 Mcg/act Susp (Fluticasone propionate) .... 2 sprays per nostril once daily  Other Orders: Flu Vaccine 102yrs + (86578) Admin 1st Vaccine (46962)  Patient Instructions: 1)  Please schedule a follow-up appointment in 1 month. 2)  Try Celebrex 200mg  one by mouth once daily.   Immunizations Administered:  Influenza Vaccine # 1:    Vaccine Type: Fluvax 3+    Site: left deltoid    Mfr: GlaxoSmithKline    Dose: 0.5 ml    Route: IM    Given by: [name] LPN    Exp. Date: 12/18/2010    Lot #: [id number]  VIS given: 01/12/10 version given March 15, 2010.  Flu Vaccine Consent Questions:    Do you have a history of severe allergic reactions to this vaccine? no    Any prior history of allergic reactions to egg and/or gelatin? no    Do you have a sensitivity to the preservative Thimersol? no    Do you have a past history of Guillan-Barre  Syndrome? no    Do you currently have an acute febrile illness? no    Have you ever had a severe reaction to latex? no    Vaccine information given and explained to patient? yes    Are you currently pregnant? no

## 2010-07-20 NOTE — Letter (Signed)
Summary: New Patient letter  [facility]  [address] [place], [place] [address]   Phone: [phone]  Fax: [phone]       02/02/2010 MRN: [id number]  [facility] [address] [place], [place]  [address]  [place]  Dear Ms. Thune,  Welcome to the Gastroenterology Division at [facility].    You are scheduled to see Dr.  Leone Payor on 03-23-10 at 1:45pm on the 3rd floor at [facility], [address]  We ask that you try to arrive at our office 15 minutes prior to your appointment time to allow for check-in.  We would like you to complete the enclosed self-administered evaluation form prior to your visit and bring it with you on the day of your appointment.  We will review it with you.  Also, please bring a complete list of all your medications or, if you prefer, bring the medication bottles and we will list them.  Please bring your insurance card so that we may make a copy of it.  If your insurance requires a referral to see a specialist, please bring your referral form from your primary care physician.  Co-payments are due at the time of your visit and may be paid by cash, check or credit card.     Your office visit will consist of a consult with your physician (includes a physical exam), any laboratory testing he/she may order, scheduling of any necessary diagnostic testing (e.g. x-ray, ultrasound, CT-scan), and scheduling of a procedure (e.g. Endoscopy, Colonoscopy) if required.  Please allow enough time on your schedule to allow for any/all of these possibilities.    If you cannot keep your appointment, please call [phone] to cancel or reschedule prior to your appointment date.  This allows [place] the opportunity to schedule an appointment for another patient in need of care.  If you do not cancel or reschedule by 5 p.m. the business day prior to your appointment date, you will be charged a $50.00 late cancellation/no-show fee.    Thank you for  choosing [facility] for your medical needs.  We appreciate the opportunity to care for you.  Please visit [place] at our website  to learn more about our practice.                     Sincerely,                                                             The Gastroenterology Division

## 2010-08-11 ENCOUNTER — Encounter: Payer: Self-pay | Admitting: Family Medicine

## 2010-08-11 ENCOUNTER — Ambulatory Visit (INDEPENDENT_AMBULATORY_CARE_PROVIDER_SITE_OTHER): Payer: Medicare Other | Admitting: Family Medicine

## 2010-08-11 VITALS — BP 140/80 | Temp 98.0°F | Ht 63.0 in | Wt 168.0 lb

## 2010-08-11 DIAGNOSIS — J309 Allergic rhinitis, unspecified: Secondary | ICD-10-CM

## 2010-08-11 DIAGNOSIS — J3089 Other allergic rhinitis: Secondary | ICD-10-CM

## 2010-08-11 NOTE — Patient Instructions (Signed)
Try over the counter Zyrtec for nasal and sinus congestion symptoms.

## 2010-08-11 NOTE — Progress Notes (Signed)
  Subjective:    Patient ID: Lynn Walters, female    DOB: 12-22-1927, 77 y.o.   MRN: [id number]  HPI  patient seen as a work in with approximately 3-4 week history of sinus pressure mostly ethmoid and frontal sinus region right greater than left. Chronic fatigue. Clear nasal mucus. Saline irrigation without much improvement. Does have frequent sneezing. Denies any fever, perinatal and nasal discharge, sore throat, or significant cough. She has not tried any over-the-counter antihistamines.   Review of Systems  as per history of present illness. Denies any appetite or weight changes    Objective:   Physical Exam  the patient is alert and nontoxic in appearance. Nasal exam is unremarkable with exception of some clear mucus bilaterally  Eardrums are normal Oropharynx is moist and clear Neck supple no adenopathy no masses Chest clear to auscultation Heart regular rhythm and rate       Assessment & Plan:   probable allergic rhinitis. No signs of bacterial infection this time. Zyrtec 10 mg daily as needed

## 2010-09-01 LAB — CARDIAC PANEL(CRET KIN+CKTOT+MB+TROPI)
CK, MB: 1.3 ng/mL (ref 0.3–4.0)
Relative Index: INVALID (ref 0.0–2.5)
Relative Index: INVALID (ref 0.0–2.5)
Total CK: 72 U/L (ref 7–177)
Total CK: 87 U/L (ref 7–177)
Troponin I: <0.01 ng/mL (ref 0.00–0.06)

## 2010-09-01 LAB — HEMOGLOBIN A1C
Hgb A1c MFr Bld: 6.1 % — ABNORMAL HIGH (ref <5.7)
Mean Plasma Glucose: 128 mg/dL — ABNORMAL HIGH (ref <117)

## 2010-09-01 LAB — CK TOTAL AND CKMB (NOT AT ARMC)
CK, MB: 1.6 ng/mL (ref 0.3–4.0)
Total CK: 90 U/L (ref 7–177)

## 2010-09-01 LAB — LIPID PANEL
LDL Cholesterol: 146 mg/dL — ABNORMAL HIGH (ref 0–99)
VLDL: 16 mg/dL (ref 0–40)

## 2010-09-01 LAB — COMPREHENSIVE METABOLIC PANEL
ALT: 14 U/L (ref 0–35)
AST: 20 U/L (ref 0–37)
BUN: 11 mg/dL (ref 6–23)
CO2: 27 mEq/L (ref 19–32)
Calcium: 9.2 mg/dL (ref 8.4–10.5)
Chloride: 109 mEq/L (ref 96–112)
Creatinine, Ser: 0.52 mg/dL (ref 0.4–1.2)
GFR calc non Af Amer: >60 mL/min (ref >60)
Glucose, Bld: 105 mg/dL — ABNORMAL HIGH (ref 70–99)
Potassium: 4 mEq/L (ref 3.5–5.1)
Total Bilirubin: 0.4 mg/dL (ref 0.3–1.2)

## 2010-09-01 LAB — BASIC METABOLIC PANEL
Chloride: 105 mEq/L (ref 96–112)
GFR calc non Af Amer: >60 mL/min (ref >60)
Glucose, Bld: 115 mg/dL — ABNORMAL HIGH (ref 70–99)
Potassium: 3.9 mEq/L (ref 3.5–5.1)
Sodium: 140 mEq/L (ref 135–145)

## 2010-09-01 LAB — GLUCOSE, CAPILLARY
Glucose-Capillary: 101 mg/dL — ABNORMAL HIGH (ref 70–99)
Glucose-Capillary: 102 mg/dL — ABNORMAL HIGH (ref 70–99)

## 2010-09-01 LAB — CBC
Hemoglobin: 13.2 g/dL (ref 12.0–15.0)
MCH: 29.6 pg (ref 26.0–34.0)
MCV: 88.3 fL (ref 78.0–100.0)
RBC: 4.46 MIL/uL (ref 3.87–5.11)
RDW: 13.2 % (ref 11.5–15.5)
WBC: 9.1 10*3/uL (ref 4.0–10.5)

## 2010-09-05 LAB — CBC
HCT: 40.3 % (ref 36.0–46.0)
Hemoglobin: 13.7 g/dL (ref 12.0–15.0)
MCH: 29.8 pg (ref 26.0–34.0)
MCHC: 34.1 g/dL (ref 30.0–36.0)
MCV: 87.4 fL (ref 78.0–100.0)
RBC: 4.61 MIL/uL (ref 3.87–5.11)

## 2010-09-05 LAB — POCT I-STAT, CHEM 8
Calcium, Ion: 1.11 mmol/L — ABNORMAL LOW (ref 1.12–1.32)
Chloride: 107 mEq/L (ref 96–112)
Glucose, Bld: 120 mg/dL — ABNORMAL HIGH (ref 70–99)
HCT: 44 % (ref 36.0–46.0)
Hemoglobin: 15 g/dL (ref 12.0–15.0)

## 2010-09-05 LAB — COMPREHENSIVE METABOLIC PANEL
CO2: 24 mEq/L (ref 19–32)
Calcium: 9.5 mg/dL (ref 8.4–10.5)
Chloride: 105 mEq/L (ref 96–112)
Creatinine, Ser: 0.63 mg/dL (ref 0.4–1.2)
GFR calc non Af Amer: >60 mL/min (ref >60)
Glucose, Bld: 126 mg/dL — ABNORMAL HIGH (ref 70–99)
Total Bilirubin: 1.1 mg/dL (ref 0.3–1.2)

## 2010-09-05 LAB — URINALYSIS, ROUTINE W REFLEX MICROSCOPIC
Bilirubin Urine: NEGATIVE
Glucose, UA: NEGATIVE mg/dL
Hgb urine dipstick: NEGATIVE
Ketones, ur: NEGATIVE mg/dL
Protein, ur: NEGATIVE mg/dL

## 2010-09-05 LAB — DIFFERENTIAL
Basophils Absolute: 0 10*3/uL (ref 0.0–0.1)
Eosinophils Absolute: 0.1 10*3/uL (ref 0.0–0.7)
Eosinophils Relative: 1 % (ref 0–5)
Lymphocytes Relative: 26 % (ref 12–46)
Lymphs Abs: 2.2 10*3/uL (ref 0.7–4.0)
Neutrophils Relative %: 67 % (ref 43–77)

## 2010-09-05 LAB — HEMOGLOBIN AND HEMATOCRIT, BLOOD: Hemoglobin: 13.5 g/dL (ref 12.0–15.0)

## 2010-09-05 LAB — SAMPLE TO BLOOD BANK

## 2010-09-29 LAB — COMPREHENSIVE METABOLIC PANEL
ALT: 12 U/L (ref 0–35)
AST: 14 U/L (ref 0–37)
Albumin: 3.6 g/dL (ref 3.5–5.2)
Calcium: 9.2 mg/dL (ref 8.4–10.5)
Creatinine, Ser: 0.79 mg/dL (ref 0.4–1.2)
GFR calc Af Amer: >60 mL/min (ref >60)
GFR calc non Af Amer: >60 mL/min (ref >60)
Sodium: 141 mEq/L (ref 135–145)
Total Protein: 6.9 g/dL (ref 6.0–8.3)

## 2010-09-29 LAB — POCT CARDIAC MARKERS
CKMB, poc: <1 ng/mL — ABNORMAL LOW (ref 1.0–8.0)
CKMB, poc: <1 ng/mL — ABNORMAL LOW (ref 1.0–8.0)
Troponin i, poc: <0.05 ng/mL (ref 0.00–0.09)

## 2010-09-29 LAB — CBC
MCHC: 34.3 g/dL (ref 30.0–36.0)
MCV: 88.2 fL (ref 78.0–100.0)
Platelets: 242 10*3/uL (ref 150–400)
RBC: 4.6 MIL/uL (ref 3.87–5.11)
RDW: 12.8 % (ref 11.5–15.5)

## 2010-09-29 LAB — DIFFERENTIAL
Eosinophils Absolute: 0.1 10*3/uL (ref 0.0–0.7)
Eosinophils Relative: 1 % (ref 0–5)
Lymphocytes Relative: 26 % (ref 12–46)
Lymphs Abs: 2.1 10*3/uL (ref 0.7–4.0)
Monocytes Absolute: 0.5 10*3/uL (ref 0.1–1.0)
Monocytes Relative: 6 % (ref 3–12)

## 2010-09-29 LAB — LIPASE, BLOOD: Lipase: 29 U/L (ref 11–59)

## 2010-10-14 ENCOUNTER — Encounter: Payer: Self-pay | Admitting: Cardiology

## 2010-10-23 ENCOUNTER — Emergency Department (HOSPITAL_COMMUNITY)
Admission: EM | Admit: 2010-10-23 | Discharge: 2010-10-23 | Disposition: A | Discharge status: Home / Self Care | Payer: Medicare Other | Attending: Emergency Medicine | Admitting: Emergency Medicine

## 2010-10-23 ENCOUNTER — Emergency Department (HOSPITAL_COMMUNITY): Payer: Medicare Other

## 2010-10-23 DIAGNOSIS — E119 Type 2 diabetes mellitus without complications: Secondary | ICD-10-CM | POA: Insufficient documentation

## 2010-10-23 DIAGNOSIS — R5381 Other malaise: Secondary | ICD-10-CM | POA: Insufficient documentation

## 2010-10-23 DIAGNOSIS — Z7982 Long term (current) use of aspirin: Secondary | ICD-10-CM | POA: Insufficient documentation

## 2010-10-23 DIAGNOSIS — Z79899 Other long term (current) drug therapy: Secondary | ICD-10-CM | POA: Insufficient documentation

## 2010-10-23 DIAGNOSIS — E7800 Pure hypercholesterolemia, unspecified: Secondary | ICD-10-CM | POA: Insufficient documentation

## 2010-10-23 DIAGNOSIS — I10 Essential (primary) hypertension: Secondary | ICD-10-CM | POA: Insufficient documentation

## 2010-10-23 DIAGNOSIS — R350 Frequency of micturition: Secondary | ICD-10-CM | POA: Insufficient documentation

## 2010-10-23 DIAGNOSIS — R079 Chest pain, unspecified: Secondary | ICD-10-CM | POA: Insufficient documentation

## 2010-10-23 LAB — CBC
HCT: 42 % (ref 36.0–46.0)
MCHC: 33.1 g/dL (ref 30.0–36.0)
MCV: 87.3 fL (ref 78.0–100.0)
Platelets: 245 10*3/uL (ref 150–400)
RDW: 13.1 % (ref 11.5–15.5)

## 2010-10-23 LAB — COMPREHENSIVE METABOLIC PANEL
ALT: 11 U/L (ref 0–35)
AST: 15 U/L (ref 0–37)
Alkaline Phosphatase: 60 U/L (ref 39–117)
CO2: 31 mEq/L (ref 19–32)
Chloride: 101 mEq/L (ref 96–112)
GFR calc Af Amer: >60 mL/min (ref >60)
GFR calc non Af Amer: >60 mL/min (ref >60)
Potassium: 4.6 mEq/L (ref 3.5–5.1)
Sodium: 139 mEq/L (ref 135–145)
Total Bilirubin: 0.3 mg/dL (ref 0.3–1.2)

## 2010-10-23 LAB — DIFFERENTIAL
Basophils Absolute: 0 10*3/uL (ref 0.0–0.1)
Eosinophils Absolute: 0.1 10*3/uL (ref 0.0–0.7)
Eosinophils Relative: 1 % (ref 0–5)
Lymphocytes Relative: 34 % (ref 12–46)
Monocytes Absolute: 0.5 10*3/uL (ref 0.1–1.0)

## 2010-10-23 LAB — CK TOTAL AND CKMB (NOT AT ARMC)
Relative Index: INVALID (ref 0.0–2.5)
Total CK: 47 U/L (ref 7–177)

## 2010-10-23 LAB — URINALYSIS, ROUTINE W REFLEX MICROSCOPIC
Glucose, UA: NEGATIVE mg/dL
Hgb urine dipstick: NEGATIVE
Specific Gravity, Urine: 1.009 (ref 1.005–1.030)

## 2010-10-25 ENCOUNTER — Telehealth: Payer: Self-pay | Admitting: Cardiology

## 2010-10-25 NOTE — Telephone Encounter (Signed)
Patient stated in ED over weekend with elevated blood pressure.  Has been elevated even at PCP, patient stated PCP wanted Dr. Patty Sermons to prescribe medications for elevated blood pressure.  Worked in with [employer].

## 2010-10-25 NOTE — Telephone Encounter (Signed)
SAID BP IS UP AND DOWN VERY WEAK. PLACED CHART IN BOX.

## 2010-10-26 ENCOUNTER — Encounter: Payer: Self-pay | Admitting: Nurse Practitioner

## 2010-10-26 ENCOUNTER — Ambulatory Visit (INDEPENDENT_AMBULATORY_CARE_PROVIDER_SITE_OTHER): Payer: Medicare Other | Admitting: Nurse Practitioner

## 2010-10-26 VITALS — BP 148/68 | HR 60 | Wt 159.0 lb

## 2010-10-26 DIAGNOSIS — I10 Essential (primary) hypertension: Secondary | ICD-10-CM

## 2010-10-26 DIAGNOSIS — R002 Palpitations: Secondary | ICD-10-CM

## 2010-10-26 MED ORDER — AMLODIPINE BESYLATE 5 MG PO TABS: 5.0000 mg | ORAL_TABLET | Freq: Every day | ORAL | Status: DC

## 2010-10-26 NOTE — Patient Instructions (Signed)
Stop the Bystolic. Start Norvasc (amlodipine) 5 mg daily Continue with your current medicines. Monitor your blood pressure at home.  Record your readings and bring to your next visit. Limit sodium intake. Call for any problems. I will see you back in about 2 weeks.

## 2010-10-26 NOTE — Assessment & Plan Note (Signed)
Blood pressure is not controlled. She does not like being on the Bystolic. I have changed her to Norvasc 5mg  daily. New prescription is sent to the drug store. I will see her back in about 2 weeks. I asked her to limit her salt. She will continue to monitor her blood pressure at home. Patient is agreeable to this plan and will call if any problems develop in the interim.

## 2010-10-26 NOTE — Progress Notes (Signed)
   Mariann Laster Date of Birth: 07-09-1927   History of Present Illness: Erionna is seen today for a work in visit. She is seen for Dr. Patty Sermons. She has been in the ER over the weekend because her blood pressure was elevated. She gets very anxious when her readings go up. She does not like being on the Bystolic. She still feels fatigued and lightheaded. She had the same reaction with Metoprolol. She does not have chest pain. She had a negative stress test in October of 2011. She is not really having palpitations. She denies shortness of breath or edema.   Current Outpatient Prescriptions on File Prior to Visit  Medication Sig Dispense Refill  . aspirin 81 MG tablet Take 81 mg by mouth daily.        . clorazepate (TRANXENE) 7.5 MG tablet Take by mouth. 1/2 tab in am , 1/2 tab in pm, 1/4 tab prn       . DISCONTD: BISOPROLOL FUMARATE PO Take 5 mg by mouth daily.        Marland Kitchen amLODipine (NORVASC) 5 MG tablet Take 1 tablet (5 mg total) by mouth daily.  30 tablet  11  . Wheat Dextrin (BENEFIBER DRINK MIX PO) Take by mouth 2 (two) times daily.          Allergies  Allergen Reactions  . Penicillins     REACTION: hives    Past Medical History  Diagnosis Date  . CONSTIPATION 01/29/2010  . DIVERTICULITIS, HX OF 09/24/2008  . DIVERTICULOSIS OF COLON 03/23/2010  . HYPERLIPIDEMIA 09/24/2008  . HYPERTENSION 09/24/2008  . Kidney stones   . Migraine   . Borderline diabetes   . Anxiety   . Heart palpitations   . Dyslipidemia     Statin intolerant    Past Surgical History  Procedure Date  . Cardiac catheterization 1992    Minimal LAD disease  . Cardiovascular stress test 2008    No ischemia; EF 82%  . Cardiovascular stress test 2011    Negative    History  Smoking status  . Never Smoker   Smokeless tobacco  . Never Used    History  Alcohol Use No    Family History  Problem Relation Age of Onset  . Cancer Mother   . Diabetes Father     Review of Systems: The review of systems is  as above.  All other systems were reviewed and are negative.  Physical Exam: BP 148/68  Pulse 60  Wt 159 lb (72.122 kg) Repeat blood pressure by me is up to 160/80. Patient is very pleasant and in no acute distress. Skin is warm and dry. Color is normal.  HEENT is unremarkable. Normocephalic/atraumatic. PERRL. Sclera are nonicteric. Neck is supple. No masses. No JVD. Lungs are clear. Cardiac exam shows a regular rate and rhythm. Abdomen is soft. Extremities are without edema. Gait and ROM are intact. No gross neurologic deficits noted.  LABORATORY DATA: Labs from the ER are reviewed. Her cardiac profiles were normal. CMET, CBC and urine were all normal. She has had a normal TSH by primary care.    Assessment / Plan:

## 2010-10-28 ENCOUNTER — Telehealth: Payer: Self-pay | Admitting: Cardiology

## 2010-10-28 NOTE — Telephone Encounter (Signed)
Patient called with B/P readings170/78 and 183/78 ,spoke with Lawson Fiscal and she advised patient to increase amlodipine to 5mg . Bid  And monitor B/P

## 2010-10-28 NOTE — Telephone Encounter (Signed)
Blood pressure is high 170/78,   183/78.  She is taking new med. And wants to talk to [facility].

## 2010-11-03 ENCOUNTER — Encounter: Payer: Self-pay | Admitting: *Deleted

## 2010-11-04 ENCOUNTER — Encounter: Payer: Self-pay | Admitting: Cardiology

## 2010-11-04 ENCOUNTER — Telehealth: Payer: Self-pay | Admitting: *Deleted

## 2010-11-04 ENCOUNTER — Ambulatory Visit (INDEPENDENT_AMBULATORY_CARE_PROVIDER_SITE_OTHER): Payer: Medicare Other | Admitting: Cardiology

## 2010-11-04 VITALS — BP 140/80 | HR 68 | Wt 160.0 lb

## 2010-11-04 DIAGNOSIS — I119 Hypertensive heart disease without heart failure: Secondary | ICD-10-CM

## 2010-11-04 DIAGNOSIS — F419 Anxiety disorder, unspecified: Secondary | ICD-10-CM

## 2010-11-04 DIAGNOSIS — F411 Generalized anxiety disorder: Secondary | ICD-10-CM

## 2010-11-04 DIAGNOSIS — I10 Essential (primary) hypertension: Secondary | ICD-10-CM

## 2010-11-04 MED ORDER — BISOPROLOL-HYDROCHLOROTHIAZIDE 2.5-6.25 MG PO TABS: 1.0000 | ORAL_TABLET | Freq: Every day | ORAL | Status: DC

## 2010-11-04 MED ORDER — CLORAZEPATE DIPOTASSIUM 7.5 MG PO TABS: 7.5000 mg | ORAL_TABLET | ORAL | Status: DC

## 2010-11-04 MED ORDER — BISOPROLOL-HYDROCHLOROTHIAZIDE 5-6.25 MG PO TABS: 1.0000 | ORAL_TABLET | Freq: Every day | ORAL | Status: DC

## 2010-11-04 NOTE — Assessment & Plan Note (Signed)
The patient has a long history of anxiety.  She becomes more anxious when she has to spend time alone in her apartment.  She does better when she is out with friends.  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  Unfortunately she no longer has a car and so this creates problems.

## 2010-11-04 NOTE — Assessment & Plan Note (Signed)
The patient has a long history of labile hypertension.  She becomes anxious and her blood pressures skyrockets.  She has had frequent emergency room visits.  Normally when she gets to the emergency room her blood pressure comes down on its own.  She had a recent trial of amlodipine which she did not react well to.  More recently she's been on bisoprolol hydrochlorothiazide in a dose of 2.5/6.25 and her blood pressure has been more satisfactory.

## 2010-11-04 NOTE — Progress Notes (Signed)
Lynn Walters Date of Birth:  [birth year] [facility] / [facility] [address]   [address] [place], [place]  [address] [phone]           Fax   [phone]  History of Present Illness: This pleasant 77 year old woman is seen for a scheduled followup office visit.  She has had recent emergency room visits for exacerbation of high blood pressure.  She recently saw Dr. Zachery Dauer who noted that she had a very low vitamin D level and prescribed vitamin D therapy for her.  The patient has not started the vitamin D therapy yet but was waiting until today's visit to see if she should do it.  I have encouraged her to proceed with the vitamin D as prescribed by Dr. Zachery Dauer.  The patient does not have any history of known ischemic heart disease.  She had a normal treadmill Cardiolite stress test on 10/16/06 and she had a normal LexiScan Cardiolite while hospitalized [facility] on 04/15/10.  Her ejection fraction was 77% on that study.  Current Outpatient Prescriptions  Medication Sig Dispense Refill  . aspirin 81 MG tablet Take 81 mg by mouth daily.        . clorazepate (TRANXENE) 7.5 MG tablet Take 1 tablet (7.5 mg total) by mouth as directed. 1/2 tab in am , 1/2 tab in pm, 1/4 tab prn  45 tablet  5  . Olopatadine HCl (PATADAY OP) Apply to eye. As directed       . DISCONTD: bisoprolol (ZEBETA) 5 MG tablet Take 1 tablet by mouth Daily. Taking 2.5 mg daily      . DISCONTD: clorazepate (TRANXENE) 7.5 MG tablet Take by mouth. 1/2 tab in am , 1/2 tab in pm, 1/4 tab prn       . bisoprolol-hydrochlorothiazide (ZIAC) 2.5-6.25 MG per tablet Take 1 tablet by mouth daily.  30 tablet  11  . Vitamin D, Ergocalciferol, (DRISDOL) 50000 UNITS CAPS Take 1 tablet by mouth.      . Wheat Dextrin (BENEFIBER DRINK MIX PO) Take by mouth 2 (two) times daily.        Marland Kitchen DISCONTD: amLODipine (NORVASC) 5 MG tablet Take 1 tablet (5 mg total) by mouth daily.  30 tablet  11  . DISCONTD: bisoprolol-hydrochlorothiazide  (ZIAC) 5-6.25 MG per tablet Take 1 tablet by mouth daily.  30 tablet  11    Allergies  Allergen Reactions  . Amlodipine   . Penicillins     REACTION: hives    Patient Active Problem List  Diagnoses  . HYPERLIPIDEMIA  . HYPERTENSION  . DIVERTICULOSIS OF COLON  . CONSTIPATION  . DIVERTICULITIS, HX OF  . Heart palpitations  . Anxiety    History  Smoking status  . Never Smoker   Smokeless tobacco  . Never Used    History  Alcohol Use No    Family History  Problem Relation Age of Onset  . Cancer Mother   . Diabetes Father     Review of Systems: Constitutional: no fever chills diaphoresis or fatigue or change in weight.  Head and neck: no hearing loss, no epistaxis, no photophobia or visual disturbance. Respiratory: No cough, shortness of breath or wheezing. Cardiovascular: No chest pain peripheral edema, palpitations. Gastrointestinal: No abdominal distention, no abdominal pain, no change in bowel habits hematochezia or melena. Genitourinary: No dysuria, no frequency, no urgency, no nocturia. Musculoskeletal:No arthralgias, no back pain, no gait disturbance or myalgias. Neurological: No dizziness, no headaches, no numbness, no  seizures, no syncope, no weakness, no tremors. Hematologic: No lymphadenopathy, no easy bruising. Psychiatric: No confusion, no hallucinations, no sleep disturbance.    Physical Exam: Filed Vitals:   11/04/10 1038  BP: 140/80  Pulse: 68  The general appearance reveals an anxious woman in no distress.Pupils equal and reactive.   Extraocular Movements are full.  There is no scleral icterus.  The mouth and pharynx are normal.  The neck is supple.  The carotids reveal no bruits.  The jugular venous pressure is normal.  The thyroid is not enlarged.  There is no lymphadenopathy.The chest is clear to percussion and auscultation. There are no rales or rhonchi. Expansion of the chest is symmetrical.The precordium is quiet.  The first heart sound is  normal.  The second heart sound is physiologically split.  There is no murmur gallop rub or click.  There is no abnormal lift or heave.The abdomen is soft and nontender. Bowel sounds are normal. The liver and spleen are not enlarged. There Are no abdominal masses. There are no bruits.The pedal pulses are good.  There is no phlebitis or edema.  There is no cyanosis or clubbing.Strength is normal and symmetrical in all extremities.  There is no lateralizing weakness.  There are no sensory deficits.The skin is warm and dry.  There is no rash.   Assessment / Plan: Continue same medication.  Encouraged her to start the vitamin D therapy as prescribed by Dr. Zachery Dauer.

## 2010-11-08 NOTE — Telephone Encounter (Signed)
Opened in error

## 2010-11-22 ENCOUNTER — Telehealth: Payer: Self-pay | Admitting: Cardiology

## 2010-11-22 ENCOUNTER — Encounter: Payer: Self-pay | Admitting: Cardiology

## 2010-11-22 DIAGNOSIS — F419 Anxiety disorder, unspecified: Secondary | ICD-10-CM

## 2010-11-22 NOTE — Telephone Encounter (Signed)
CALL PT ABT HER SCRIPTS, SHE IS IN [place]. CHART IN BOX.

## 2010-11-23 NOTE — Telephone Encounter (Signed)
Patient phoned requesting her tranxene be changed from 7.5 mg to 3.75 mg.  New rx faxed to [facility]

## 2010-11-24 ENCOUNTER — Other Ambulatory Visit: Payer: Self-pay | Admitting: *Deleted

## 2010-11-24 DIAGNOSIS — F419 Anxiety disorder, unspecified: Secondary | ICD-10-CM

## 2010-11-24 MED ORDER — CLORAZEPATE DIPOTASSIUM 3.75 MG PO TABS: ORAL_TABLET | ORAL | Status: DC

## 2010-11-24 NOTE — Telephone Encounter (Signed)
Patient called wanted to change pill size of tranxene so it would be easier for her to take.  New rx faxed to [facility]

## 2010-12-03 ENCOUNTER — Telehealth: Payer: Self-pay | Admitting: Cardiology

## 2010-12-03 DIAGNOSIS — I10 Essential (primary) hypertension: Secondary | ICD-10-CM

## 2010-12-03 MED ORDER — BISOPROLOL-HYDROCHLOROTHIAZIDE 2.5-6.25 MG PO TABS: ORAL_TABLET | ORAL | Status: DC

## 2010-12-03 NOTE — Telephone Encounter (Signed)
Patient said that she is not feeling well; possible BP issues.   She thinks that it may be her medications.  She is in [facility] and saw a provider there. He stated that she may be having an adverse rx to the beta blocker she's taking. He suggested that she see her doctor here.

## 2010-12-03 NOTE — Telephone Encounter (Signed)
With daughter.  Lightheaded, legs weak, and just sits in chair.  Diagnosed with adverse drug reaction to beta blocker, and advised for her to stop.  Didn't give her anything in place of it.  Please advise.

## 2010-12-03 NOTE — Telephone Encounter (Signed)
Advised patient and if any problems with this dose to call back

## 2010-12-03 NOTE — Telephone Encounter (Signed)
Have her restart her generic Ziac 2.5 mg just every other day or if she prefers she can take a half of a tablet every day.

## 2010-12-13 ENCOUNTER — Telehealth: Payer: Self-pay | Admitting: Cardiology

## 2010-12-13 ENCOUNTER — Other Ambulatory Visit: Payer: Self-pay | Admitting: Family Medicine

## 2010-12-13 DIAGNOSIS — R42 Dizziness and giddiness: Secondary | ICD-10-CM

## 2010-12-13 DIAGNOSIS — H538 Other visual disturbances: Secondary | ICD-10-CM

## 2010-12-13 NOTE — Telephone Encounter (Signed)
Called wanting to schedule an appointment with Dr. Patty Sermons sooner than her July appointment to talk about her medication that she is having problems with. Please call back. I have pulled her chart.

## 2010-12-14 NOTE — Telephone Encounter (Signed)
Patient did not decrease her ziac as previously advised.  Keep scheduled office visit.

## 2010-12-15 NOTE — Telephone Encounter (Signed)
And decrease medications (ziac to 1/2 daily) as advised at the last telephone conversation.  All per Dr. Patty Sermons.

## 2010-12-16 ENCOUNTER — Ambulatory Visit
Admission: RE | Admit: 2010-12-16 | Discharge: 2010-12-16 | Disposition: A | Discharge status: Home / Self Care | Payer: Medicare Other | Source: Ambulatory Visit | Attending: Family Medicine | Admitting: Family Medicine

## 2010-12-16 DIAGNOSIS — R42 Dizziness and giddiness: Secondary | ICD-10-CM

## 2010-12-16 DIAGNOSIS — H538 Other visual disturbances: Secondary | ICD-10-CM

## 2010-12-17 ENCOUNTER — Other Ambulatory Visit: Payer: Self-pay | Admitting: Family Medicine

## 2010-12-17 ENCOUNTER — Emergency Department (HOSPITAL_COMMUNITY)
Admission: EM | Admit: 2010-12-17 | Discharge: 2010-12-17 | Disposition: A | Discharge status: Home / Self Care | Payer: Medicare Other | Attending: Emergency Medicine | Admitting: Emergency Medicine

## 2010-12-17 DIAGNOSIS — E119 Type 2 diabetes mellitus without complications: Secondary | ICD-10-CM | POA: Insufficient documentation

## 2010-12-17 DIAGNOSIS — I10 Essential (primary) hypertension: Secondary | ICD-10-CM | POA: Insufficient documentation

## 2010-12-17 DIAGNOSIS — Z1231 Encounter for screening mammogram for malignant neoplasm of breast: Secondary | ICD-10-CM

## 2010-12-17 DIAGNOSIS — R42 Dizziness and giddiness: Secondary | ICD-10-CM | POA: Insufficient documentation

## 2010-12-17 LAB — DIFFERENTIAL
Basophils Relative: 0 % (ref 0–1)
Eosinophils Absolute: 0.2 10*3/uL (ref 0.0–0.7)
Eosinophils Relative: 3 % (ref 0–5)
Lymphs Abs: 3.1 10*3/uL (ref 0.7–4.0)
Monocytes Absolute: 0.7 10*3/uL (ref 0.1–1.0)
Monocytes Relative: 7 % (ref 3–12)
Neutrophils Relative %: 57 % (ref 43–77)

## 2010-12-17 LAB — COMPREHENSIVE METABOLIC PANEL
AST: 15 U/L (ref 0–37)
CO2: 30 mEq/L (ref 19–32)
Calcium: 9.7 mg/dL (ref 8.4–10.5)
Creatinine, Ser: 0.61 mg/dL (ref 0.50–1.10)
GFR calc Af Amer: >60 mL/min (ref >60)
GFR calc non Af Amer: >60 mL/min (ref >60)
Sodium: 136 mEq/L (ref 135–145)
Total Protein: 7.9 g/dL (ref 6.0–8.3)

## 2010-12-17 LAB — CBC
MCH: 28.7 pg (ref 26.0–34.0)
MCHC: 32.7 g/dL (ref 30.0–36.0)
MCV: 87.7 fL (ref 78.0–100.0)
Platelets: 240 10*3/uL (ref 150–400)
RBC: 4.78 MIL/uL (ref 3.87–5.11)

## 2010-12-30 ENCOUNTER — Other Ambulatory Visit: Payer: Self-pay | Admitting: Diagnostic Radiology

## 2010-12-31 ENCOUNTER — Other Ambulatory Visit: Payer: Self-pay | Admitting: Diagnostic Radiology

## 2011-01-05 ENCOUNTER — Ambulatory Visit (INDEPENDENT_AMBULATORY_CARE_PROVIDER_SITE_OTHER): Payer: Medicare Other | Admitting: Cardiology

## 2011-01-05 ENCOUNTER — Encounter: Payer: Self-pay | Admitting: Cardiology

## 2011-01-05 ENCOUNTER — Ambulatory Visit
Admission: RE | Admit: 2011-01-05 | Discharge: 2011-01-05 | Disposition: A | Discharge status: Home / Self Care | Payer: Medicare Other | Source: Ambulatory Visit | Attending: Family Medicine | Admitting: Family Medicine

## 2011-01-05 DIAGNOSIS — F419 Anxiety disorder, unspecified: Secondary | ICD-10-CM

## 2011-01-05 DIAGNOSIS — Z1231 Encounter for screening mammogram for malignant neoplasm of breast: Secondary | ICD-10-CM

## 2011-01-05 DIAGNOSIS — I10 Essential (primary) hypertension: Secondary | ICD-10-CM

## 2011-01-05 DIAGNOSIS — R42 Dizziness and giddiness: Secondary | ICD-10-CM

## 2011-01-05 NOTE — Progress Notes (Signed)
Lynn Walters Date of Birth:  25-Jan-1928 [facility] / [facility] [address]   [address] [place], [place]  [address] [phone]           Fax   [phone]  History of Present Illness: This 77 year old woman is seen for a scheduled followup office visit.  She has a past history of severe labile hypertension exacerbated by emotional stress.  She does not have known coronary disease.  She had a previous nuclear stress test in 2008 which was normal and she was hospitalized overnight at [facility] in October 2011 and ruled out for myocardial infarction after experiencing atypical chest pain.  She had a subsequent LexiScan Myoview which was negative for ischemia and her ejection fraction was 77%.  The patient has a history of anxiety and depression.  Her primary care provider Dr. Juluis Rainier has suggested a mental health exam for the patient and the patient is willing to proceed in that direction.  I encouraged her to do so.  The patient has a history of dyslipidemia with intolerance to statin therapy.  Current Outpatient Prescriptions  Medication Sig Dispense Refill  . aspirin 81 MG tablet Take 81 mg by mouth daily.        . bisoprolol-hydrochlorothiazide (ZIAC) 2.5-6.25 MG per tablet 1/2 daily or every other day or directed  10 tablet  3  . clorazepate (TRANXENE-T) 3.75 MG tablet 1 tablet every morning and evening and 1/2 tablet as needed  225 tablet  1  . guaiFENesin (MUCINEX) 600 MG 12 hr tablet Take 1,200 mg by mouth 2 (two) times daily.        . Olopatadine HCl (PATADAY OP) Apply to eye. As directed       . Vitamin D, Ergocalciferol, (DRISDOL) 50000 UNITS CAPS Take 1 tablet by mouth.      . Wheat Dextrin (BENEFIBER DRINK MIX PO) Take by mouth 2 (two) times daily.          Allergies  Allergen Reactions  . Amlodipine   . Penicillins     REACTION: hives    Patient Active Problem List  Diagnoses  . HYPERLIPIDEMIA  . HYPERTENSION  . DIVERTICULOSIS OF  COLON  . CONSTIPATION  . DIVERTICULITIS, HX OF  . Heart palpitations  . Anxiety  . Vertigo    History  Smoking status  . Never Smoker   Smokeless tobacco  . Never Used    History  Alcohol Use No    Family History  Problem Relation Age of Onset  . Cancer Mother   . Diabetes Father     Review of Systems: Constitutional: no fever chills diaphoresis or fatigue or change in weight.  Head and neck: no hearing loss, no epistaxis, no photophobia or visual disturbance. Respiratory: No cough, shortness of breath or wheezing. Cardiovascular: No chest pain peripheral edema, palpitations. Gastrointestinal: No abdominal distention, no abdominal pain, no change in bowel habits hematochezia or melena. Genitourinary: No dysuria, no frequency, no urgency, no nocturia. Musculoskeletal:No arthralgias, no back pain, no gait disturbance or myalgias. Neurological: No dizziness, no headaches, no numbness, no seizures, no syncope, no weakness, no tremors. Hematologic: No lymphadenopathy, no easy bruising. Psychiatric: No confusion, no hallucinations, no sleep disturbance.    Physical Exam: Filed Vitals:   01/05/11 1101  BP: 130/70  Pulse: 80    The general appearance reveals an anxious-appearing woman in no distress.Pupils equal and reactive.   Extraocular Movements are full.  There is no scleral icterus.  The mouth and pharynx are normal.  The neck is supple.  The carotids reveal no bruits.  The jugular venous pressure is normal.  The thyroid is not enlarged.  There is no lymphadenopathy.  The chest is clear to percussion and auscultation. There are no rales or rhonchi. Expansion of the chest is symmetrical.  The precordium is quiet.  The first heart sound is normal.  The second heart sound is physiologically split.  There is no murmur gallop rub or click.  There is no abnormal lift or heave.  The abdomen is soft and nontender. Bowel sounds are normal. The liver and spleen are not enlarged.  There Are no abdominal masses. There are no bruits.  The pedal pulses are good.  There is no phlebitis or edema.  There is no cyanosis or clubbing.  Strength is normal and symmetrical in all extremities.  There is no lateralizing weakness.  There are no sensory deficits.  The skin is warm and dry.  There is no rash.     Assessment / Plan:  the patient is to continue same medication.  I've encouraged her to follow through on the mental health exam so we can get her anxiety under better control and this in turn will help her blood pressure control and save her some emergency room visits to the emergency room

## 2011-01-05 NOTE — Assessment & Plan Note (Signed)
The patient has a long history of anxiety.  She does better when she is out with people and worse when she stays in her home by herself.  We have encouraged her to try to get out and do some volunteer work at her church or the schools etc. Her anxiety has become a severe problem and her primary care physician Dr. Zachery Dauer has recommended a mental health examination and I told her that I supported that idea.  Presently she is on low dose Tranxene which is helping some but I told her we would welcome the  help of a specialist in this regard

## 2011-01-05 NOTE — Assessment & Plan Note (Signed)
The patient has a long history of labile hypertension.  She has severe anxiety which adversely affects her blood pressure.  She has had multiple trips to urgent care centers and to the emergency room.  She does not have any history of ischemic heart disease.  She had a normal nuclear stress test on 10/16/06.  Her ejection fraction was 82%.  She has had an intolerance to most of the medication that we have tried.  Presently she is tolerating low dose bisoprolol HCT.  She brought in a long list of home blood pressure readings which are quite labile which we reviewed today

## 2011-01-05 NOTE — Assessment & Plan Note (Signed)
The patient was most recently seen in the emergency room at [facility] on 12/17/10 for vertigo.  She was given a prescription for meclizine but has not taken much of it the vertigo itself appears to have improved spontaneously

## 2011-01-06 ENCOUNTER — Ambulatory Visit: Payer: Medicare Other

## 2011-03-11 ENCOUNTER — Emergency Department (HOSPITAL_COMMUNITY): Payer: Medicare Other

## 2011-03-11 ENCOUNTER — Emergency Department (HOSPITAL_COMMUNITY)
Admission: EM | Admit: 2011-03-11 | Discharge: 2011-03-12 | Disposition: A | Discharge status: Home / Self Care | Payer: Medicare Other | Attending: Emergency Medicine | Admitting: Emergency Medicine

## 2011-03-11 DIAGNOSIS — Z79899 Other long term (current) drug therapy: Secondary | ICD-10-CM | POA: Insufficient documentation

## 2011-03-11 DIAGNOSIS — R42 Dizziness and giddiness: Secondary | ICD-10-CM | POA: Insufficient documentation

## 2011-03-11 DIAGNOSIS — I10 Essential (primary) hypertension: Secondary | ICD-10-CM | POA: Insufficient documentation

## 2011-03-11 LAB — GLUCOSE, CAPILLARY

## 2011-03-11 LAB — CBC
MCHC: 34.7 g/dL (ref 30.0–36.0)
Platelets: 251 10*3/uL (ref 150–400)
RDW: 12.7 % (ref 11.5–15.5)

## 2011-03-11 LAB — DIFFERENTIAL
Basophils Absolute: 0 10*3/uL (ref 0.0–0.1)
Basophils Relative: 0 % (ref 0–1)
Eosinophils Absolute: 0.2 10*3/uL (ref 0.0–0.7)
Eosinophils Relative: 1 % (ref 0–5)
Monocytes Absolute: 0.6 10*3/uL (ref 0.1–1.0)

## 2011-03-11 LAB — POCT I-STAT TROPONIN I: Troponin i, poc: 0.01 ng/mL (ref 0.00–0.08)

## 2011-03-11 LAB — POCT I-STAT, CHEM 8
Calcium, Ion: 1.17 mmol/L (ref 1.12–1.32)
HCT: 43 % (ref 36.0–46.0)
Sodium: 140 mEq/L (ref 135–145)
TCO2: 25 mmol/L (ref 0–100)

## 2011-03-12 LAB — URINE MICROSCOPIC-ADD ON

## 2011-03-12 LAB — URINALYSIS, ROUTINE W REFLEX MICROSCOPIC
Bilirubin Urine: NEGATIVE
Ketones, ur: NEGATIVE mg/dL
Nitrite: NEGATIVE
Protein, ur: NEGATIVE mg/dL
Urobilinogen, UA: 0.2 mg/dL (ref 0.0–1.0)
pH: 6.5 (ref 5.0–8.0)

## 2011-03-12 LAB — POCT I-STAT TROPONIN I: Troponin i, poc: 0.01 ng/mL (ref 0.00–0.08)

## 2011-04-25 ENCOUNTER — Encounter: Payer: Self-pay | Admitting: Cardiology

## 2011-04-25 ENCOUNTER — Ambulatory Visit (INDEPENDENT_AMBULATORY_CARE_PROVIDER_SITE_OTHER): Payer: Medicare Other | Admitting: Cardiology

## 2011-04-25 VITALS — BP 120/80 | HR 66 | Ht 64.0 in | Wt 162.0 lb

## 2011-04-25 DIAGNOSIS — F419 Anxiety disorder, unspecified: Secondary | ICD-10-CM

## 2011-04-25 DIAGNOSIS — E785 Hyperlipidemia, unspecified: Secondary | ICD-10-CM

## 2011-04-25 DIAGNOSIS — I119 Hypertensive heart disease without heart failure: Secondary | ICD-10-CM

## 2011-04-25 DIAGNOSIS — R002 Palpitations: Secondary | ICD-10-CM

## 2011-04-25 DIAGNOSIS — J329 Chronic sinusitis, unspecified: Secondary | ICD-10-CM

## 2011-04-25 DIAGNOSIS — E7800 Pure hypercholesterolemia, unspecified: Secondary | ICD-10-CM

## 2011-04-25 DIAGNOSIS — I10 Essential (primary) hypertension: Secondary | ICD-10-CM

## 2011-04-25 MED ORDER — CLORAZEPATE DIPOTASSIUM 3.75 MG PO TABS: ORAL_TABLET | ORAL | Status: DC

## 2011-04-25 NOTE — Patient Instructions (Signed)
Will send your chlorazepate to [facility]  Your physician recommends that you schedule a follow-up appointment in: 4 months

## 2011-04-25 NOTE — Assessment & Plan Note (Signed)
Her palpitations have improved on the bisoprolol which has also helped her blood pressure

## 2011-04-25 NOTE — Assessment & Plan Note (Signed)
The patient has not been expressing any dizziness or syncope.  She does note occasional palpitations.  She is not getting any regular exercise but hopes to start back to the gymnasium soon.  She feels that now that her psychologist has helped her she is ready to get out and being more active with activities outside of the house.

## 2011-04-25 NOTE — Assessment & Plan Note (Signed)
The patient has a history of hypercholesterolemia.  She is not presently on any statin therapy.  Her lipids are being followed by her primary care.  She has not been as careful with her diet her weight is up 6 pounds since last visit.

## 2011-04-25 NOTE — Progress Notes (Signed)
Lynn Walters Date of Birth:  1927/10/03 [facility] / [facility] [address]   [address] [place], [place]  [address] [phone]           Fax   [phone]  History of Present Illness: This pleasant 77 year old woman is seen for a scheduled followup office visit.  She has a history of labile hypertension.  She also has a history of anxiety and depression.  She is presently seeing a psychologist and she feels that this has helped.  Her new primary care physician is Dr. Juluis Rainier.  The patient has not been experiencing any chest pain or shortness of breath.  Current Outpatient Prescriptions  Medication Sig Dispense Refill  . aspirin 81 MG tablet Take 81 mg by mouth daily.        . bisoprolol-hydrochlorothiazide (ZIAC) 2.5-6.25 MG per tablet 1/2 daily       . clorazepate (TRANXENE-T) 3.75 MG tablet 1 tablet every morning and evening and 1/2 tablet as needed  225 tablet  1  . guaiFENesin (MUCINEX) 600 MG 12 hr tablet Take 1,200 mg by mouth 2 (two) times daily.          Allergies  Allergen Reactions  . Amlodipine   . Penicillins     REACTION: hives    Patient Active Problem List  Diagnoses  . HYPERLIPIDEMIA  . HYPERTENSION  . DIVERTICULOSIS OF COLON  . CONSTIPATION  . DIVERTICULITIS, HX OF  . Heart palpitations  . Anxiety  . Vertigo    History  Smoking status  . Never Smoker   Smokeless tobacco  . Never Used    History  Alcohol Use No    Family History  Problem Relation Age of Onset  . Cancer Mother   . Diabetes Father     Review of Systems: Constitutional: no fever chills diaphoresis or fatigue or change in weight.  Head and neck: no hearing loss, no epistaxis, no photophobia or visual disturbance. Respiratory: No cough, shortness of breath or wheezing. Cardiovascular: No chest pain peripheral edema, palpitations. Gastrointestinal: No abdominal distention, no abdominal pain, no change in bowel habits hematochezia or  melena. Genitourinary: No dysuria, no frequency, no urgency, no nocturia. Musculoskeletal:No arthralgias, no back pain, no gait disturbance or myalgias. Neurological: No dizziness, no headaches, no numbness, no seizures, no syncope, no weakness, no tremors. Hematologic: No lymphadenopathy, no easy bruising. Psychiatric: No confusion, no hallucinations, no sleep disturbance.    Physical Exam: Filed Vitals:   04/25/11 1056  BP: 120/80  Pulse: 66   the general appearance reveals a well-developed elderly woman in no acute distress.The head and neck exam reveals pupils equal and reactive.  Extraocular movements are full.  There is no scleral icterus.  The mouth and pharynx are normal.  The neck is supple.  The carotids reveal no bruits.  The jugular venous pressure is normal.  The  thyroid is not enlarged.  There is no lymphadenopathy.  The chest is clear to percussion and auscultation.  There are no rales or rhonchi.  Expansion of the chest is symmetrical.  The precordium is quiet.  The first heart sound is normal.  The second heart sound is physiologically split.  There is no murmur gallop rub or click.  There is no abnormal lift or heave.  The abdomen is soft and nontender.  The bowel sounds are normal.  The liver and spleen are not enlarged.  There are no abdominal masses.  There are no abdominal bruits.  Extremities reveal good pedal pulses.  There is no phlebitis or edema.  There is no cyanosis or clubbing.  Strength is normal and symmetrical in all extremities.  There is no lateralizing weakness.  There are no sensory deficits.  The skin is warm and dry.  There is no rash.     Assessment / Plan: Continue same medication including generic Tranxene 3.75 mg 3 times a day Or Montez Morita on exercise and diet and weight loss.  Recheck in 4 months for a followup office visit and EKG

## 2011-05-06 ENCOUNTER — Ambulatory Visit: Payer: Medicare Other | Admitting: Nurse Practitioner

## 2011-05-06 ENCOUNTER — Telehealth: Payer: Self-pay | Admitting: Cardiology

## 2011-05-06 NOTE — Telephone Encounter (Signed)
Pt states she called the on-call cardiologist this morning around 2:30am.  She states she was having increased palpitations, elevated bp and a headache yesterday and today.  BP yesterday was 188/85 with a pulse in the 90's.  Today it is running 171/95 1-2 hours after taking her BP meds.  She has been taking her clorazepate 3-4x per day.

## 2011-05-06 NOTE — Telephone Encounter (Signed)
Follow up from previous call.   Patient having problem with her blood pressure. Wants to know what's her next step

## 2011-05-06 NOTE — Telephone Encounter (Signed)
Pt was given an appt with Norma Fredrickson NP this morning.

## 2011-05-06 NOTE — Telephone Encounter (Signed)
Pt call c/o a lot of palpitations, increased BP, and headache. Pt said she talked to MD on call and he told her she needs to come into office today. Please return pt call to discuss further.

## 2011-07-05 DIAGNOSIS — H1045 Other chronic allergic conjunctivitis: Secondary | ICD-10-CM | POA: Diagnosis not present

## 2011-07-13 DIAGNOSIS — H1045 Other chronic allergic conjunctivitis: Secondary | ICD-10-CM | POA: Diagnosis not present

## 2011-07-13 DIAGNOSIS — H43819 Vitreous degeneration, unspecified eye: Secondary | ICD-10-CM | POA: Diagnosis not present

## 2011-08-17 DIAGNOSIS — J309 Allergic rhinitis, unspecified: Secondary | ICD-10-CM | POA: Diagnosis not present

## 2011-08-23 ENCOUNTER — Encounter: Payer: Self-pay | Admitting: Cardiology

## 2011-08-23 ENCOUNTER — Ambulatory Visit (INDEPENDENT_AMBULATORY_CARE_PROVIDER_SITE_OTHER): Payer: Medicare Other | Admitting: Cardiology

## 2011-08-23 DIAGNOSIS — F419 Anxiety disorder, unspecified: Secondary | ICD-10-CM

## 2011-08-23 DIAGNOSIS — F411 Generalized anxiety disorder: Secondary | ICD-10-CM | POA: Diagnosis not present

## 2011-08-23 DIAGNOSIS — I10 Essential (primary) hypertension: Secondary | ICD-10-CM

## 2011-08-23 DIAGNOSIS — E785 Hyperlipidemia, unspecified: Secondary | ICD-10-CM | POA: Diagnosis not present

## 2011-08-23 MED ORDER — CLORAZEPATE DIPOTASSIUM 3.75 MG PO TABS: ORAL_TABLET | ORAL | Status: DC

## 2011-08-23 NOTE — Assessment & Plan Note (Signed)
The patient's blood pressure has been more stable recently.  She's not having any severe headaches.  She's not having any dizzy spells.

## 2011-08-23 NOTE — Patient Instructions (Signed)
Your physician recommends that you continue on your current medications as directed. Please refer to the Current Medication list given to you today. Your physician wants you to follow-up in: 4 months You will receive a reminder letter in the mail two months in advance. If you don't receive a letter, please call our office to schedule the follow-up appointment.  

## 2011-08-23 NOTE — Assessment & Plan Note (Signed)
Patient remains chronically anxious.  We did refill her prescription for generic Tranxene 1 tablet 3 times a day.

## 2011-08-23 NOTE — Progress Notes (Signed)
Lynn Walters Date of Birth:  [birth year] [facility] [address] [place], [place]  [address] [phone]         Fax   [phone]  History of Present Illness: This pleasant 78 year old woman is seen for a scheduled four-month followup office visit.  She has a history of palpitations and history of labile hypertension.  She has a history of anxiety and depression.  Since last visit she's also had a lot of problems with allergic sinusitis and has seen her primary care physician Dr. Tenny Craw who gave her a prescription for a 10 mg prednisone Dosepak but the patient has not started it yet.  Current Outpatient Prescriptions  Medication Sig Dispense Refill  . aspirin 81 MG tablet Take 81 mg by mouth daily.        . bisoprolol-hydrochlorothiazide (ZIAC) 2.5-6.25 MG per tablet 1/2 daily       . clorazepate (TRANXENE-T) 3.75 MG tablet 1 tablet every morning and evening and 1/2 tablet as needed or as directed  270 tablet  1  . levocetirizine (XYZAL) 5 MG tablet       . guaiFENesin (MUCINEX) 600 MG 12 hr tablet Take 1,200 mg by mouth 2 (two) times daily.          Allergies  Allergen Reactions  . Amlodipine   . Penicillins     REACTION: hives    Patient Active Problem List  Diagnoses  . HYPERLIPIDEMIA  . HYPERTENSION  . DIVERTICULOSIS OF COLON  . CONSTIPATION  . DIVERTICULITIS, HX OF  . Heart palpitations  . Anxiety  . Vertigo    History  Smoking status  . Never Smoker   Smokeless tobacco  . Never Used    History  Alcohol Use No    Family History  Problem Relation Age of Onset  . Cancer Mother   . Diabetes Father     Review of Systems: Constitutional: no fever chills diaphoresis or fatigue or change in weight.  Head and neck: no hearing loss, no epistaxis, no photophobia or visual disturbance. Respiratory: No cough, shortness of breath or wheezing. Cardiovascular: No chest pain peripheral edema, palpitations. Gastrointestinal: No abdominal  distention, no abdominal pain, no change in bowel habits hematochezia or melena. Genitourinary: No dysuria, no frequency, no urgency, no nocturia. Musculoskeletal:No arthralgias, no back pain, no gait disturbance or myalgias. Neurological: No dizziness, no headaches, no numbness, no seizures, no syncope, no weakness, no tremors. Hematologic: No lymphadenopathy, no easy bruising. Psychiatric: No confusion, no hallucinations, no sleep disturbance.    Physical Exam: Filed Vitals:   08/23/11 1044  BP: 130/76  Pulse: 70  Resp: 18   the general appearance reveals a well-developed well-nourished elderly woman in no distress.The head and neck exam reveals pupils equal and reactive.  Extraocular movements are full.  There is no scleral icterus.  The mouth and pharynx are normal.  The neck is supple.  The carotids reveal no bruits.  The jugular venous pressure is normal.  The  thyroid is not enlarged.  There is no lymphadenopathy.  The chest is clear to percussion and auscultation.  There are no rales or rhonchi.  Expansion of the chest is symmetrical.  The precordium is quiet.  The first heart sound is normal.  The second heart sound is physiologically split.  There is no murmur gallop rub or click.  There is no abnormal lift or heave.  The abdomen is soft and nontender.  The bowel sounds are normal.  The  liver and spleen are not enlarged.  There are no abdominal masses.  There are no abdominal bruits.  Extremities reveal good pedal pulses.  There is no phlebitis or edema.  There is no cyanosis or clubbing.  Strength is normal and symmetrical in all extremities.  There is no lateralizing weakness.  There are no sensory deficits.  The skin is warm and dry.  There is no rash.     Assessment / Plan: Continue present medication.  Work harder on weight loss and walking exercise.  Continue treatment for her allergic sinusitis with her primary care provider.  Recheck here in 4 months for followup office visit  and EKG.  blood work today is pending

## 2011-08-23 NOTE — Assessment & Plan Note (Signed)
The patient has a past history of hyperlipidemia.  Presently she is not on any lipid lowering agents.  She is trying to control her diet.  Unfortunately her weight is up 6 pounds since last visit.  I have encouraged her to try to get more regular walking exercise to help her depression and to help her weight loss and her cholesterol.

## 2011-08-25 DIAGNOSIS — H1045 Other chronic allergic conjunctivitis: Secondary | ICD-10-CM | POA: Diagnosis not present

## 2011-09-13 DIAGNOSIS — I10 Essential (primary) hypertension: Secondary | ICD-10-CM | POA: Diagnosis not present

## 2011-09-13 DIAGNOSIS — L821 Other seborrheic keratosis: Secondary | ICD-10-CM | POA: Diagnosis not present

## 2011-09-13 DIAGNOSIS — Z733 Stress, not elsewhere classified: Secondary | ICD-10-CM | POA: Diagnosis not present

## 2011-09-13 DIAGNOSIS — J309 Allergic rhinitis, unspecified: Secondary | ICD-10-CM | POA: Diagnosis not present

## 2011-09-15 DIAGNOSIS — F4323 Adjustment disorder with mixed anxiety and depressed mood: Secondary | ICD-10-CM | POA: Diagnosis not present

## 2011-09-16 DIAGNOSIS — H43819 Vitreous degeneration, unspecified eye: Secondary | ICD-10-CM | POA: Diagnosis not present

## 2011-09-28 DIAGNOSIS — J342 Deviated nasal septum: Secondary | ICD-10-CM | POA: Diagnosis not present

## 2011-09-28 DIAGNOSIS — F4323 Adjustment disorder with mixed anxiety and depressed mood: Secondary | ICD-10-CM | POA: Diagnosis not present

## 2011-09-28 DIAGNOSIS — R51 Headache: Secondary | ICD-10-CM | POA: Diagnosis not present

## 2011-09-28 DIAGNOSIS — J310 Chronic rhinitis: Secondary | ICD-10-CM | POA: Diagnosis not present

## 2011-10-12 DIAGNOSIS — J309 Allergic rhinitis, unspecified: Secondary | ICD-10-CM | POA: Diagnosis not present

## 2011-10-12 DIAGNOSIS — F329 Major depressive disorder, single episode, unspecified: Secondary | ICD-10-CM | POA: Diagnosis not present

## 2011-10-14 ENCOUNTER — Emergency Department (HOSPITAL_COMMUNITY): Payer: Medicare Other

## 2011-10-14 ENCOUNTER — Encounter (HOSPITAL_COMMUNITY): Payer: Self-pay

## 2011-10-14 ENCOUNTER — Emergency Department (HOSPITAL_COMMUNITY)
Admission: EM | Admit: 2011-10-14 | Discharge: 2011-10-14 | Disposition: A | Discharge status: Home / Self Care | Payer: Medicare Other | Attending: Emergency Medicine | Admitting: Emergency Medicine

## 2011-10-14 DIAGNOSIS — I2510 Atherosclerotic heart disease of native coronary artery without angina pectoris: Secondary | ICD-10-CM | POA: Insufficient documentation

## 2011-10-14 DIAGNOSIS — R51 Headache: Secondary | ICD-10-CM | POA: Diagnosis not present

## 2011-10-14 DIAGNOSIS — H538 Other visual disturbances: Secondary | ICD-10-CM | POA: Insufficient documentation

## 2011-10-14 DIAGNOSIS — F411 Generalized anxiety disorder: Secondary | ICD-10-CM | POA: Insufficient documentation

## 2011-10-14 DIAGNOSIS — E785 Hyperlipidemia, unspecified: Secondary | ICD-10-CM | POA: Diagnosis not present

## 2011-10-14 DIAGNOSIS — H539 Unspecified visual disturbance: Secondary | ICD-10-CM | POA: Insufficient documentation

## 2011-10-14 DIAGNOSIS — I119 Hypertensive heart disease without heart failure: Secondary | ICD-10-CM | POA: Diagnosis not present

## 2011-10-14 DIAGNOSIS — F329 Major depressive disorder, single episode, unspecified: Secondary | ICD-10-CM | POA: Insufficient documentation

## 2011-10-14 DIAGNOSIS — Z79899 Other long term (current) drug therapy: Secondary | ICD-10-CM | POA: Diagnosis not present

## 2011-10-14 DIAGNOSIS — M47812 Spondylosis without myelopathy or radiculopathy, cervical region: Secondary | ICD-10-CM | POA: Diagnosis not present

## 2011-10-14 DIAGNOSIS — F3289 Other specified depressive episodes: Secondary | ICD-10-CM | POA: Insufficient documentation

## 2011-10-14 DIAGNOSIS — I10 Essential (primary) hypertension: Secondary | ICD-10-CM | POA: Diagnosis not present

## 2011-10-14 DIAGNOSIS — R5381 Other malaise: Secondary | ICD-10-CM | POA: Insufficient documentation

## 2011-10-14 DIAGNOSIS — R5383 Other fatigue: Secondary | ICD-10-CM | POA: Insufficient documentation

## 2011-10-14 LAB — POCT I-STAT, CHEM 8
Calcium, Ion: 1.25 mmol/L (ref 1.12–1.32)
Chloride: 103 mEq/L (ref 96–112)
Glucose, Bld: 115 mg/dL — ABNORMAL HIGH (ref 70–99)
HCT: 44 % (ref 36.0–46.0)
Hemoglobin: 15 g/dL (ref 12.0–15.0)

## 2011-10-14 LAB — SEDIMENTATION RATE: Sed Rate: 22 mm/hr (ref 0–22)

## 2011-10-14 MED ORDER — ACETAMINOPHEN 325 MG PO TABS
650.0000 mg | ORAL_TABLET | Freq: Once | ORAL | Status: AC
Start: 2011-10-14 — End: 2011-10-14
  Administered 2011-10-14: 650 mg via ORAL
  Filled 2011-10-14: qty 2

## 2011-10-14 NOTE — ED Provider Notes (Addendum)
History     CSN: [id number]  Arrival date & time 10/14/11  1103   First MD Initiated Contact with Patient 10/14/11 1134      Chief Complaint  Patient presents with  . Hypertension  . Headache    (Consider location/radiation/quality/duration/timing/severity/associated sxs/prior treatment) HPI Awaken with headache this morning pressure-like occipital with slight blurred vision. vision is normal at present headache is mild, no treatment prior to coming here no nausea no other associated symptoms aside from generalized weakness which she's had for one month, unchanged. No chest pain no fever no head trauma no falls. No treatment prior to coming here. Nothing makes symptoms better or worse. Headache is improving with time and visual changes had resolved spontaneously. Past Medical History  Diagnosis Date  . CONSTIPATION 01/29/2010  . DIVERTICULITIS, HX OF 09/24/2008  . DIVERTICULOSIS OF COLON 03/23/2010  . HYPERLIPIDEMIA 09/24/2008  . HYPERTENSION 09/24/2008  . Kidney stones   . Migraine   . Borderline diabetes   . Anxiety   . Heart palpitations   . Dyslipidemia     Statin intolerant  . Hypertension   . Palpitations     Essential  . Depression   . CAD (coronary artery disease)     Past Surgical History  Procedure Date  . Cardiac catheterization 1992    Minimal LAD disease  . Cardiovascular stress test 2008    No ischemia; EF 82%  . Cardiovascular stress test 2011    Negative    Family History  Problem Relation Age of Onset  . Cancer Mother   . Diabetes Father     History  Substance Use Topics  . Smoking status: Never Smoker   . Smokeless tobacco: Never Used  . Alcohol Use: No    OB History    Grav Para Term Preterm Abortions TAB SAB Ect Mult Living                  Review of Systems  Eyes: Positive for visual disturbance.  Respiratory: Negative.   Cardiovascular: Negative.   Gastrointestinal: Negative.   Musculoskeletal: Negative.   Skin: Negative.     Neurological: Positive for weakness and headaches.  Hematological: Negative.   Psychiatric/Behavioral: Negative.   All other systems reviewed and are negative.    Allergies  Amlodipine; Penicillins; and Shellfish allergy  Home Medications   Current Outpatient Rx  Name Route Sig Dispense Refill  . ASPIRIN 81 MG PO TABS Oral Take 81 mg by mouth daily.      Marland Kitchen BISOPROLOL-HYDROCHLOROTHIAZIDE 2.5-6.25 MG PO TABS Oral Take 0.5 tablets by mouth daily. 1/2 daily    . CLORAZEPATE DIPOTASSIUM 3.75 MG PO TABS  1 tablet every morning and evening and 1/2 tablet as needed or as directed 180 tablet 1  . FLUTICASONE PROPIONATE 50 MCG/ACT NA SUSP Nasal Place 2 sprays into the nose at bedtime.      BP 160/70  Pulse 76  Temp 97.9 F (36.6 C)  Resp 18  SpO2 97%  Physical Exam  Nursing note and vitals reviewed. Constitutional: She appears well-developed and well-nourished.  HENT:  Head: Normocephalic and atraumatic.  Eyes: Conjunctivae are normal. Pupils are equal, round, and reactive to light.  Neck: Neck supple. No tracheal deviation present. No thyromegaly present.  Cardiovascular: Normal rate and regular rhythm.   No murmur heard. Pulmonary/Chest: Effort normal and breath sounds normal.  Abdominal: Soft. Bowel sounds are normal. She exhibits no distension. There is no tenderness.  Musculoskeletal: Normal range of motion.  She exhibits no edema and no tenderness.  Neurological: She is alert. She has normal reflexes. Coordination normal.       Gait normal Romberg normal pronator drift normal  Skin: Skin is warm and dry. No rash noted.  Psychiatric: She has a normal mood and affect.    Date: 10/14/2011  Rate: 70  Rhythm: normal sinus rhythm  QRS Axis: left  Intervals: normal  ST/T Wave abnormalities: nonspecific T wave changes  Conduction Disutrbances:right bundle branch block  Narrative Interpretation:   Old EKG Reviewed: unchanged No significant change from 03/11/11  ED Course   Procedures (including critical care time) 4 PM feels much improved after treatment with Tylenol  Labs Reviewed  SEDIMENTATION RATE   No results found.   No diagnosis found.   Results for orders placed during the hospital encounter of 10/14/11  SEDIMENTATION RATE      Component Value Range   Sed Rate 22  0 - 22 (mm/hr)  POCT I-STAT, CHEM 8      Component Value Range   Sodium 142  135 - 145 (mEq/L)   Potassium 3.8  3.5 - 5.1 (mEq/L)   Chloride 103  96 - 112 (mEq/L)   BUN 12  6 - 23 (mg/dL)   Creatinine, Ser 4.54  0.50 - 1.10 (mg/dL)   Glucose, Bld 098 (*) 70 - 99 (mg/dL)   Calcium, Ion 1.19  1.47 - 1.32 (mmol/L)   TCO2 30  0 - 100 (mmol/L)   Hemoglobin 15.0  12.0 - 15.0 (g/dL)   HCT 82.9  56.2 - 13.0 (%)   Ct Head Wo Contrast  10/14/2011  *RADIOLOGY REPORT*  Clinical Data:  Hypertension.  Headache.  CT HEAD WITHOUT CONTRAST CT MAXILLOFACIAL WITHOUT CONTRAST  Technique:  Multidetector CT imaging of the head and maxillofacial structures were performed using the standard protocol without intravenous contrast. Multiplanar CT image reconstructions of the maxillofacial structures were also generated.  Comparison:  03/11/2011 and 09/10/2007 head CT.  CT HEAD  Findings: No intracranial hemorrhage.  Mild small vessel disease type changes without CT evidence of large acute infarct.  No hydrocephalus.  Mild age related atrophy.  Prominence of the basilar tip.  This has been noted on prior exams and I suspect represents ectasia rather than aneurysm as best appreciated on the face CT reconstructed images.  No intracranial mass lesion detected on this unenhanced exam.  Partial opacification anterior ethmoid sinus air cells.  IMPRESSION: No intracranial hemorrhage or CT evidence of large acute infarct.  Please see above.  CT MAXILLOFACIAL:  Findings: Liver:  spondylotic changes with spinal stenosis and mild cord flattening C3-4, C4-5 and C5-6.  Mild mucosal thickening inferior aspect of the right  frontal sinus.  Mild partial opacification anterior ethmoid sinus air cells more notable on the right.  Minimal mucosal thickening inferior aspect right maxillary sinus appear  Remainder of the paranasal sinuses, mastoid air cells and middle ear cavities are clear.  Polypoid opacification superior aspect of the anterior portion of the left nasal vault.  Small polyp at this level not excluded.  IMPRESSION:  Mild mucosal thickening inferior aspect of the right frontal sinus.  Mild partial opacification anterior ethmoid sinus air cells more notable on the right.  Minimal mucosal thickening inferior aspect right maxillary sinus appear  Remainder of the paranasal sinuses, mastoid air cells and middle ear cavities are clear.  Polypoid opacification superior aspect of the anterior portion of the left nasal vault.  Small polyp at this level not  excluded.  Cervical spondylotic changes as noted above.  Original Report Authenticated By: Fuller Canada, M.D.   Ct Maxillofacial Wo Cm  10/14/2011  *RADIOLOGY REPORT*  Clinical Data:  Hypertension.  Headache.  CT HEAD WITHOUT CONTRAST CT MAXILLOFACIAL WITHOUT CONTRAST  Technique:  Multidetector CT imaging of the head and maxillofacial structures were performed using the standard protocol without intravenous contrast. Multiplanar CT image reconstructions of the maxillofacial structures were also generated.  Comparison:  03/11/2011 and 09/10/2007 head CT.  CT HEAD  Findings: No intracranial hemorrhage.  Mild small vessel disease type changes without CT evidence of large acute infarct.  No hydrocephalus.  Mild age related atrophy.  Prominence of the basilar tip.  This has been noted on prior exams and I suspect represents ectasia rather than aneurysm as best appreciated on the face CT reconstructed images.  No intracranial mass lesion detected on this unenhanced exam.  Partial opacification anterior ethmoid sinus air cells.  IMPRESSION: No intracranial hemorrhage or CT evidence of  large acute infarct.  Please see above.  CT MAXILLOFACIAL:  Findings: Liver:  spondylotic changes with spinal stenosis and mild cord flattening C3-4, C4-5 and C5-6.  Mild mucosal thickening inferior aspect of the right frontal sinus.  Mild partial opacification anterior ethmoid sinus air cells more notable on the right.  Minimal mucosal thickening inferior aspect right maxillary sinus appear  Remainder of the paranasal sinuses, mastoid air cells and middle ear cavities are clear.  Polypoid opacification superior aspect of the anterior portion of the left nasal vault.  Small polyp at this level not excluded.  IMPRESSION:  Mild mucosal thickening inferior aspect of the right frontal sinus.  Mild partial opacification anterior ethmoid sinus air cells more notable on the right.  Minimal mucosal thickening inferior aspect right maxillary sinus appear  Remainder of the paranasal sinuses, mastoid air cells and middle ear cavities are clear.  Polypoid opacification superior aspect of the anterior portion of the left nasal vault.  Small polyp at this level not excluded.  Cervical spondylotic changes as noted above.  Original Report Authenticated By: Fuller Canada, M.D.    MDM  Headache is felt to be nonspecific Plan Tylenol for pain Followup Dr. Tenny Craw as needed Diagnosis#1 nonspecific headache\ #2 chronic generalized weakness        Doug Sou, MD 10/14/11 1606  Doug Sou, MD 10/14/11 1610

## 2011-10-14 NOTE — ED Notes (Signed)
Per EMS: Pt c/o generalized weakness, headache and HTN. BP 200/102, 118 CBG. Able to move all extremities. AO x4. 5/10 pain upon arrival. No signs of acute distress.

## 2011-10-14 NOTE — ED Notes (Signed)
Pt presents to department for evaluation of headache, generalized weakness, and hypertension. States she woke up this morning with "pressure" sensation to head. Also states she feels fatigued. Noticed that her BP was elevated this morning, states she takes medications as prescribed. Also states intermittent chest pressure for several months. Able to move all extremities without difficulty. No neuro deficits. Skin warm and dry. Pt is conscious alert and oriented x4. No signs of distress noted at the time.

## 2011-10-14 NOTE — ED Notes (Signed)
Patient transported to CT 

## 2011-10-14 NOTE — ED Notes (Signed)
Pt discharged home, had no further questions. Vital signs stable.

## 2011-10-14 NOTE — Discharge Instructions (Signed)

## 2011-10-24 DIAGNOSIS — H2510 Age-related nuclear cataract, unspecified eye: Secondary | ICD-10-CM | POA: Diagnosis not present

## 2011-10-26 DIAGNOSIS — J342 Deviated nasal septum: Secondary | ICD-10-CM | POA: Diagnosis not present

## 2011-10-26 DIAGNOSIS — H60399 Other infective otitis externa, unspecified ear: Secondary | ICD-10-CM | POA: Diagnosis not present

## 2011-10-26 DIAGNOSIS — J310 Chronic rhinitis: Secondary | ICD-10-CM | POA: Diagnosis not present

## 2011-10-26 DIAGNOSIS — H9190 Unspecified hearing loss, unspecified ear: Secondary | ICD-10-CM | POA: Diagnosis not present

## 2011-11-03 DIAGNOSIS — F411 Generalized anxiety disorder: Secondary | ICD-10-CM | POA: Diagnosis not present

## 2011-11-03 DIAGNOSIS — F329 Major depressive disorder, single episode, unspecified: Secondary | ICD-10-CM | POA: Diagnosis not present

## 2011-11-03 DIAGNOSIS — I10 Essential (primary) hypertension: Secondary | ICD-10-CM | POA: Diagnosis not present

## 2011-11-03 DIAGNOSIS — E559 Vitamin D deficiency, unspecified: Secondary | ICD-10-CM | POA: Diagnosis not present

## 2011-11-09 DIAGNOSIS — F4323 Adjustment disorder with mixed anxiety and depressed mood: Secondary | ICD-10-CM | POA: Diagnosis not present

## 2011-11-09 DIAGNOSIS — H9110 Presbycusis, unspecified ear: Secondary | ICD-10-CM | POA: Diagnosis not present

## 2011-11-09 DIAGNOSIS — H60399 Other infective otitis externa, unspecified ear: Secondary | ICD-10-CM | POA: Diagnosis not present

## 2011-11-21 ENCOUNTER — Telehealth: Payer: Self-pay | Admitting: Cardiology

## 2011-11-21 MED ORDER — BISOPROLOL-HYDROCHLOROTHIAZIDE 2.5-6.25 MG PO TABS: 0.5000 | ORAL_TABLET | Freq: Every day | ORAL | Status: DC

## 2011-11-21 NOTE — Telephone Encounter (Signed)
New Problem:    Patient called in needing a 90 day refill of her clorazepate (TRANXENE-T) 3.75 MG tablet.

## 2011-11-24 ENCOUNTER — Telehealth: Payer: Self-pay | Admitting: Cardiology

## 2011-11-24 NOTE — Telephone Encounter (Signed)
Spoke with patient regarding her trip.

## 2011-11-24 NOTE — Telephone Encounter (Signed)
New msg  Pt wants to talk to you about going out of town for three months and what to do in case of emergency.

## 2011-11-28 ENCOUNTER — Telehealth: Payer: Self-pay | Admitting: Cardiology

## 2011-11-28 DIAGNOSIS — F419 Anxiety disorder, unspecified: Secondary | ICD-10-CM

## 2011-11-28 NOTE — Telephone Encounter (Signed)
Left message to call back with pharmacy name

## 2011-11-28 NOTE — Telephone Encounter (Signed)
New problem:    Patient called in needing a refill of her clorazepate (TRANXENE-T) 3.75 MG tablet.

## 2011-11-28 NOTE — Telephone Encounter (Signed)
Lynn Walters  Non cardiac med.Marland Kitchen

## 2011-11-29 MED ORDER — CLORAZEPATE DIPOTASSIUM 3.75 MG PO TABS: ORAL_TABLET | ORAL | Status: DC

## 2011-11-29 NOTE — Telephone Encounter (Signed)
Will fax to [facility] as requested

## 2011-11-29 NOTE — Telephone Encounter (Signed)
Pt rtn your call from yesterday and the Pharm is [facility]

## 2012-02-15 ENCOUNTER — Ambulatory Visit: Payer: Medicare Other | Admitting: Cardiology

## 2012-03-09 DIAGNOSIS — R079 Chest pain, unspecified: Secondary | ICD-10-CM | POA: Diagnosis not present

## 2012-03-13 DIAGNOSIS — I10 Essential (primary) hypertension: Secondary | ICD-10-CM | POA: Diagnosis not present

## 2012-03-13 DIAGNOSIS — R002 Palpitations: Secondary | ICD-10-CM | POA: Diagnosis not present

## 2012-03-13 DIAGNOSIS — R0609 Other forms of dyspnea: Secondary | ICD-10-CM | POA: Diagnosis not present

## 2012-03-13 DIAGNOSIS — R0989 Other specified symptoms and signs involving the circulatory and respiratory systems: Secondary | ICD-10-CM | POA: Diagnosis not present

## 2012-03-13 DIAGNOSIS — R55 Syncope and collapse: Secondary | ICD-10-CM | POA: Diagnosis not present

## 2012-03-15 ENCOUNTER — Telehealth: Payer: Self-pay | Admitting: Cardiology

## 2012-03-15 NOTE — Telephone Encounter (Signed)
Pt had an abnormal ekg, not sure what she's wanting has a heavy accent, has appt 10-1 with brackbill, wants to talk with a nurse

## 2012-03-15 NOTE — Telephone Encounter (Signed)
Pt is in [place] visiting her daughter and had near syncope.  Is undergoing a cardiac workup to include labs, EKG and event monitor .  Pt had to cancel 03/20/2012 appt with Dr. Patty Sermons because she is not comfortable driving herself back from [place].  Will reschedule when she gets back to town.

## 2012-03-18 DIAGNOSIS — R0789 Other chest pain: Secondary | ICD-10-CM | POA: Diagnosis not present

## 2012-03-18 DIAGNOSIS — F411 Generalized anxiety disorder: Secondary | ICD-10-CM | POA: Diagnosis not present

## 2012-03-18 DIAGNOSIS — R55 Syncope and collapse: Secondary | ICD-10-CM | POA: Diagnosis not present

## 2012-03-18 DIAGNOSIS — I2510 Atherosclerotic heart disease of native coronary artery without angina pectoris: Secondary | ICD-10-CM | POA: Diagnosis not present

## 2012-03-18 DIAGNOSIS — R079 Chest pain, unspecified: Secondary | ICD-10-CM | POA: Diagnosis not present

## 2012-03-18 DIAGNOSIS — I10 Essential (primary) hypertension: Secondary | ICD-10-CM | POA: Diagnosis not present

## 2012-03-18 DIAGNOSIS — R5383 Other fatigue: Secondary | ICD-10-CM | POA: Diagnosis not present

## 2012-03-18 DIAGNOSIS — Z23 Encounter for immunization: Secondary | ICD-10-CM | POA: Diagnosis not present

## 2012-03-19 DIAGNOSIS — R55 Syncope and collapse: Secondary | ICD-10-CM | POA: Diagnosis not present

## 2012-03-19 DIAGNOSIS — R079 Chest pain, unspecified: Secondary | ICD-10-CM | POA: Diagnosis not present

## 2012-03-20 ENCOUNTER — Ambulatory Visit: Payer: Medicare Other | Admitting: Cardiology

## 2012-03-23 DIAGNOSIS — R002 Palpitations: Secondary | ICD-10-CM | POA: Diagnosis not present

## 2012-03-23 DIAGNOSIS — F411 Generalized anxiety disorder: Secondary | ICD-10-CM | POA: Diagnosis not present

## 2012-03-23 DIAGNOSIS — I10 Essential (primary) hypertension: Secondary | ICD-10-CM | POA: Diagnosis not present

## 2012-03-27 DIAGNOSIS — I10 Essential (primary) hypertension: Secondary | ICD-10-CM | POA: Diagnosis not present

## 2012-03-27 DIAGNOSIS — R1011 Right upper quadrant pain: Secondary | ICD-10-CM | POA: Diagnosis not present

## 2012-03-29 DIAGNOSIS — R1011 Right upper quadrant pain: Secondary | ICD-10-CM | POA: Diagnosis not present

## 2012-04-16 DIAGNOSIS — I10 Essential (primary) hypertension: Secondary | ICD-10-CM | POA: Diagnosis not present

## 2012-04-16 DIAGNOSIS — R0609 Other forms of dyspnea: Secondary | ICD-10-CM | POA: Diagnosis not present

## 2012-04-16 DIAGNOSIS — R072 Precordial pain: Secondary | ICD-10-CM | POA: Diagnosis not present

## 2012-04-16 DIAGNOSIS — R002 Palpitations: Secondary | ICD-10-CM | POA: Diagnosis not present

## 2012-04-19 DIAGNOSIS — F329 Major depressive disorder, single episode, unspecified: Secondary | ICD-10-CM | POA: Diagnosis not present

## 2012-04-19 DIAGNOSIS — I10 Essential (primary) hypertension: Secondary | ICD-10-CM | POA: Diagnosis not present

## 2012-04-19 DIAGNOSIS — N390 Urinary tract infection, site not specified: Secondary | ICD-10-CM | POA: Diagnosis not present

## 2012-05-11 ENCOUNTER — Telehealth: Payer: Self-pay | Admitting: Cardiology

## 2012-05-11 NOTE — Telephone Encounter (Signed)
Advised patient, keep scheduled ov

## 2012-05-11 NOTE — Telephone Encounter (Signed)
Blood pressure running 130's/50's heart rate 60's.  Blood pressure 122/55 hr 62 last night, night before134/54 hr 59. Will forward to  Dr. Patty Sermons.

## 2012-05-11 NOTE — Telephone Encounter (Signed)
plz return call to pt [phone] regarding elevated BP and palpitations during the night.

## 2012-05-11 NOTE — Telephone Encounter (Signed)
Those numbers are okay.  Her symptoms may be secondary to problems with her nerves.

## 2012-05-11 NOTE — Telephone Encounter (Signed)
Unable to sleep, wakes up every three hours with palpitations. Wakes up nervous and blood pressure elevated.  Had to go to ED in [place] and everything checked out ok.  Has been put on Citalpram for anxiety

## 2012-05-30 ENCOUNTER — Encounter: Payer: Self-pay | Admitting: Cardiology

## 2012-05-30 ENCOUNTER — Ambulatory Visit (INDEPENDENT_AMBULATORY_CARE_PROVIDER_SITE_OTHER): Payer: Medicare Other | Admitting: Cardiology

## 2012-05-30 VITALS — BP 140/80 | HR 66 | Resp 18 | Ht 65.0 in | Wt 159.0 lb

## 2012-05-30 DIAGNOSIS — I452 Bifascicular block: Secondary | ICD-10-CM | POA: Diagnosis not present

## 2012-05-30 DIAGNOSIS — R002 Palpitations: Secondary | ICD-10-CM

## 2012-05-30 DIAGNOSIS — F419 Anxiety disorder, unspecified: Secondary | ICD-10-CM

## 2012-05-30 DIAGNOSIS — F411 Generalized anxiety disorder: Secondary | ICD-10-CM | POA: Diagnosis not present

## 2012-05-30 DIAGNOSIS — I10 Essential (primary) hypertension: Secondary | ICD-10-CM | POA: Diagnosis not present

## 2012-05-30 DIAGNOSIS — I119 Hypertensive heart disease without heart failure: Secondary | ICD-10-CM | POA: Diagnosis not present

## 2012-05-30 NOTE — Progress Notes (Signed)
Lynn Walters Date of Birth:  1927-07-09 [facility] [address] [place], [place]  [address] [phone]         Fax   [phone]  History of Present Illness: This pleasant 78 year old woman is seen for a scheduled four-month followup office visit. She has a history of palpitations and history of labile hypertension. She has a history of anxiety and depression.  [place] we last saw [place].  She was hospitalized overnight.  She had a Lexus scan Myoview stress test which showed no ischemia and her ejection fraction was 87%.  She had a two-dimensional echocardiogram which was normal and her ejection fraction was 70-75%.  These tests were done on 03/19/12.  At discharge she was told that she was anxious and she was placed on citalopram which has helped her.   Current Outpatient Prescriptions  Medication Sig Dispense Refill  . aspirin 81 MG tablet Take 81 mg by mouth daily.        . bisoprolol-hydrochlorothiazide (ZIAC) 2.5-6.25 MG per tablet Take 0.5 tablets by mouth daily. 1/2 daily  45 tablet  3  . citalopram (CELEXA) 10 MG tablet       . clorazepate (TRANXENE-T) 3.75 MG tablet 1 tablet every morning and evening and 1/2 tablet as needed or as directed  200 tablet  1  . fluticasone (FLONASE) 50 MCG/ACT nasal spray Place 2 sprays into the nose at bedtime.        Allergies  Allergen Reactions  . Amlodipine   . Penicillins Hives  . Shellfish Allergy Itching and Rash    Patient Active Problem List  Diagnosis  . HYPERLIPIDEMIA  . HYPERTENSION  . DIVERTICULOSIS OF COLON  . CONSTIPATION  . DIVERTICULITIS, HX OF  . Heart palpitations  . Anxiety  . Vertigo  . Bifascicular block    History  Smoking status  . Never Smoker   Smokeless tobacco  . Never Used    History  Alcohol Use No    Family History  Problem Relation Age of Onset  . Cancer Mother   . Diabetes Father     Review of  Systems: Constitutional: no fever chills diaphoresis or fatigue or change in weight.  Head and neck: no hearing loss, no epistaxis, no photophobia or visual disturbance. Respiratory: No cough, shortness of breath or wheezing. Cardiovascular: No chest pain peripheral edema, palpitations. Gastrointestinal: No abdominal distention, no abdominal pain, no change in bowel habits hematochezia or melena. Genitourinary: No dysuria, no frequency, no urgency, no nocturia. Musculoskeletal:No arthralgias, no back pain, no gait disturbance or myalgias. Neurological: No dizziness, no headaches, no numbness, no seizures, no syncope, no weakness, no tremors. Hematologic: No lymphadenopathy, no easy bruising. Psychiatric: No confusion, no hallucinations, no sleep disturbance.    Physical Exam: Filed Vitals:   05/30/12 1538  BP: 140/80  Pulse: 66  Resp:    the general appearance reveals a well-developed well-nourished woman in no distress.The head and neck exam reveals pupils equal and reactive.  Extraocular movements are full.  There is no scleral icterus.  The mouth and pharynx are normal.  The neck is supple.  The carotids reveal no bruits.  The jugular venous pressure is normal.  The  thyroid is not enlarged.  There is no lymphadenopathy.  The chest is clear to percussion and auscultation.  There are no rales or rhonchi.  Expansion of the chest is symmetrical.  The precordium is quiet.  The first heart sound is normal.  The second heart sound is physiologically split.  There is no murmur gallop rub or click.  There is no abnormal lift or heave.  The abdomen is soft and nontender.  The bowel sounds are normal.  The liver and spleen are not enlarged.  There are no abdominal masses.  There are no abdominal bruits.  Extremities reveal good pedal pulses.  There is no phlebitis or edema.  There is no cyanosis or clubbing.  Strength is normal and symmetrical in all extremities.  There is no lateralizing weakness.   There are no sensory deficits.  The skin is warm and dry.  There is no rash.  EKG shows normal sinus rhythm with bifascicular block unchanged from 10/14/11   Assessment / Plan: Continue same medication.  Recheck in 4 months for office visit

## 2012-05-30 NOTE — Assessment & Plan Note (Signed)
Her symptoms of anxiety and stress has improved significantly since last visit.  Much more cheerful today.  She is on generic Tranxene and is on citalopram.

## 2012-05-30 NOTE — Assessment & Plan Note (Signed)
Her initial blood pressure on arrival was elevated here at the office.  Subsequently I rechecked her blood pressure was down to 140/80.

## 2012-05-30 NOTE — Assessment & Plan Note (Signed)
Her palpitations have improved since last visit

## 2012-05-30 NOTE — Patient Instructions (Addendum)
Your physician recommends that you continue on your current medications as directed. Please refer to the Current Medication list given to you today. Your physician wants you to follow-up in: 4 months You will receive a reminder letter in the mail two months in advance. If you don't receive a letter, please call our office to schedule the follow-up appointment.  

## 2012-05-30 NOTE — Assessment & Plan Note (Signed)
EKG today shows bifascicular block unchanged from 10/14/11.  She is not having any syncope or Stokes-Adams attacks

## 2012-07-20 ENCOUNTER — Other Ambulatory Visit: Payer: Self-pay | Admitting: Emergency Medicine

## 2012-07-20 MED ORDER — BISOPROLOL-HYDROCHLOROTHIAZIDE 2.5-6.25 MG PO TABS: 0.5000 | ORAL_TABLET | Freq: Every day | ORAL | Status: DC

## 2012-08-09 ENCOUNTER — Other Ambulatory Visit: Payer: Self-pay | Admitting: *Deleted

## 2012-08-09 MED ORDER — BISOPROLOL-HYDROCHLOROTHIAZIDE 2.5-6.25 MG PO TABS: 0.5000 | ORAL_TABLET | Freq: Every day | ORAL | Status: DC

## 2012-09-27 ENCOUNTER — Encounter: Payer: Self-pay | Admitting: Cardiology

## 2012-09-27 ENCOUNTER — Ambulatory Visit (INDEPENDENT_AMBULATORY_CARE_PROVIDER_SITE_OTHER): Payer: Medicare Other | Admitting: Cardiology

## 2012-09-27 VITALS — BP 128/70 | HR 70 | Ht 65.0 in | Wt 163.0 lb

## 2012-09-27 DIAGNOSIS — F419 Anxiety disorder, unspecified: Secondary | ICD-10-CM

## 2012-09-27 DIAGNOSIS — I10 Essential (primary) hypertension: Secondary | ICD-10-CM | POA: Diagnosis not present

## 2012-09-27 DIAGNOSIS — R252 Cramp and spasm: Secondary | ICD-10-CM | POA: Diagnosis not present

## 2012-09-27 DIAGNOSIS — R002 Palpitations: Secondary | ICD-10-CM

## 2012-09-27 DIAGNOSIS — E785 Hyperlipidemia, unspecified: Secondary | ICD-10-CM | POA: Diagnosis not present

## 2012-09-27 DIAGNOSIS — R7301 Impaired fasting glucose: Secondary | ICD-10-CM | POA: Diagnosis not present

## 2012-09-27 DIAGNOSIS — I119 Hypertensive heart disease without heart failure: Secondary | ICD-10-CM

## 2012-09-27 DIAGNOSIS — E559 Vitamin D deficiency, unspecified: Secondary | ICD-10-CM | POA: Diagnosis not present

## 2012-09-27 DIAGNOSIS — F411 Generalized anxiety disorder: Secondary | ICD-10-CM | POA: Diagnosis not present

## 2012-09-27 NOTE — Progress Notes (Signed)
Lynn Walters Date of Birth:  Nov 05, 1927 [facility] [address] [place], [place]  [address] [phone]         Fax   [phone]  History of Present Illness: This pleasant 79 year old woman is seen for a scheduled four-month followup office visit. She has a history of palpitations and history of labile hypertension. She has a history of anxiety and depression.  Last autumn she was vacationing down at [place] and had chest pain. She was hospitalized overnight. She had a Lexus scan Myoview stress test which showed no ischemia and her ejection fraction was 87%. She had a two-dimensional echocardiogram which was normal and her ejection fraction was 70-75%. These tests were done on 03/19/12. At discharge she was told that she was anxious and she was placed on citalopram which has helped her.   Current Outpatient Prescriptions  Medication Sig Dispense Refill  . aspirin 81 MG tablet Take 81 mg by mouth daily.        . bisoprolol-hydrochlorothiazide (ZIAC) 2.5-6.25 MG per tablet Take 0.5 tablets by mouth daily. 1/2 daily  45 tablet  3  . citalopram (CELEXA) 10 MG tablet       . clorazepate (TRANXENE-T) 3.75 MG tablet 1 tablet every morning and evening and 1/2 tablet as needed or as directed  200 tablet  1  . fluticasone (FLONASE) 50 MCG/ACT nasal spray Place 2 sprays into the nose at bedtime.       No current facility-administered medications for this visit.    Allergies  Allergen Reactions  . Amlodipine   . Penicillins Hives  . Shellfish Allergy Itching and Rash    Patient Active Problem List  Diagnosis  . HYPERLIPIDEMIA  . HYPERTENSION  . DIVERTICULOSIS OF COLON  . CONSTIPATION  . DIVERTICULITIS, HX OF  . Heart palpitations  . Anxiety  . Vertigo  . Bifascicular block    History  Smoking status  . Never Smoker   Smokeless tobacco  . Never Used    History  Alcohol Use No    Family History  Problem Relation Age of Onset  . Cancer Mother     . Diabetes Father     Review of Systems: Constitutional: no fever chills diaphoresis or fatigue or change in weight.  Head and neck: no hearing loss, no epistaxis, no photophobia or visual disturbance. Respiratory: No cough, shortness of breath or wheezing. Cardiovascular: No chest pain peripheral edema, palpitations. Gastrointestinal: No abdominal distention, no abdominal pain, no change in bowel habits hematochezia or melena. Genitourinary: No dysuria, no frequency, no urgency, no nocturia. Musculoskeletal:No arthralgias, no back pain, no gait disturbance or myalgias. Neurological: No dizziness, no headaches, no numbness, no seizures, no syncope, no weakness, no tremors. Hematologic: No lymphadenopathy, no easy bruising. Psychiatric: No confusion, no hallucinations, no sleep disturbance.    Physical Exam: Filed Vitals:   09/27/12 1059  BP: 128/70  Pulse: 70   the general appearance reveals a well-developed well-nourished woman in no distress.The head and neck exam reveals pupils equal and reactive.  Extraocular movements are full.  There is no scleral icterus.  The mouth and pharynx are normal.  The neck is supple.  The carotids reveal no bruits.  The jugular venous pressure is normal.  The  thyroid is not enlarged.  There is no lymphadenopathy.  The chest is clear to percussion and auscultation.  There are no rales or rhonchi.  Expansion of the chest is symmetrical.  The precordium is quiet.  The first heart sound is normal.  The second heart sound is physiologically split.  There is no murmur gallop rub or click.  There is no abnormal lift or heave.  The abdomen is soft and nontender.  The bowel sounds are normal.  The liver and spleen are not enlarged.  There are no abdominal masses.  There are no abdominal bruits.  Extremities reveal good pedal pulses.  There is no phlebitis or edema.  There is no cyanosis or clubbing.  Strength is normal and symmetrical in all extremities.  There is no  lateralizing weakness.  There are no sensory deficits.  The skin is warm and dry.  There is no rash.     Assessment / Plan: Continue same medication.  Her weight is up 4 pounds since last visit and she will try to lose weight.  Keep exercising.  Recheck in 4 months for followup office visit

## 2012-09-27 NOTE — Patient Instructions (Addendum)
Work harder on [employer] loss  Your physician recommends that you continue on your current medications as directed. Please refer to the Current Medication list given to you today.  Your physician wants you to follow-up in: 4 month ov You will receive a reminder letter in the mail two months in advance. If you don't receive a letter, please call our office to schedule the follow-up appointment.

## 2012-09-27 NOTE — Assessment & Plan Note (Signed)
The patient previously had been fixated on her blood pressure numbers and it was so worried about her blood pressure that it caused her blood pressure to be high.  Since the patient was started on citalopram she is less anxious and she stopped checking her blood pressure.  Blood pressure in office today is excellent.

## 2012-09-27 NOTE — Assessment & Plan Note (Signed)
The patient has been able to cut back on her clorazepate and generally just takes one at bedtime.  Occasionally she will take a half a tablet other times during the day.  The citalopram has helped greatly and she will continue it.  Her mood has been much better since moving back to [place].  She is back living in her own home and driving her car.

## 2012-09-27 NOTE — Assessment & Plan Note (Signed)
The patient has a past history of tachycardia palpitations.  She states that her heart rate has been much more stable and she has been less anxious.  She remains on bisoprolol HCT

## 2012-10-04 DIAGNOSIS — R252 Cramp and spasm: Secondary | ICD-10-CM | POA: Diagnosis not present

## 2012-10-04 DIAGNOSIS — F411 Generalized anxiety disorder: Secondary | ICD-10-CM | POA: Diagnosis not present

## 2012-10-04 DIAGNOSIS — E785 Hyperlipidemia, unspecified: Secondary | ICD-10-CM | POA: Diagnosis not present

## 2012-10-04 DIAGNOSIS — I10 Essential (primary) hypertension: Secondary | ICD-10-CM | POA: Diagnosis not present

## 2012-10-04 DIAGNOSIS — E559 Vitamin D deficiency, unspecified: Secondary | ICD-10-CM | POA: Diagnosis not present

## 2012-10-04 DIAGNOSIS — R7301 Impaired fasting glucose: Secondary | ICD-10-CM | POA: Diagnosis not present

## 2012-10-23 DIAGNOSIS — H18419 Arcus senilis, unspecified eye: Secondary | ICD-10-CM | POA: Diagnosis not present

## 2012-12-17 ENCOUNTER — Other Ambulatory Visit: Payer: Self-pay | Admitting: *Deleted

## 2012-12-17 DIAGNOSIS — F419 Anxiety disorder, unspecified: Secondary | ICD-10-CM

## 2012-12-17 MED ORDER — CLORAZEPATE DIPOTASSIUM 3.75 MG PO TABS: ORAL_TABLET | ORAL | Status: DC

## 2013-01-16 DIAGNOSIS — IMO0001 Reserved for inherently not codable concepts without codable children: Secondary | ICD-10-CM | POA: Diagnosis not present

## 2013-01-28 ENCOUNTER — Encounter: Payer: Self-pay | Admitting: Cardiology

## 2013-01-28 ENCOUNTER — Ambulatory Visit (INDEPENDENT_AMBULATORY_CARE_PROVIDER_SITE_OTHER): Payer: Medicare Other | Admitting: Cardiology

## 2013-01-28 VITALS — BP 148/80 | HR 72 | Ht 64.0 in | Wt 166.8 lb

## 2013-01-28 DIAGNOSIS — I119 Hypertensive heart disease without heart failure: Secondary | ICD-10-CM | POA: Diagnosis not present

## 2013-01-28 DIAGNOSIS — E785 Hyperlipidemia, unspecified: Secondary | ICD-10-CM | POA: Diagnosis not present

## 2013-01-28 DIAGNOSIS — I10 Essential (primary) hypertension: Secondary | ICD-10-CM

## 2013-01-28 DIAGNOSIS — M25519 Pain in unspecified shoulder: Secondary | ICD-10-CM

## 2013-01-28 DIAGNOSIS — M25512 Pain in left shoulder: Secondary | ICD-10-CM

## 2013-01-28 NOTE — Progress Notes (Signed)
Lynn Walters Date of Birth:  02-10-1928 [facility] [address] [place], [place]  [address] [phone]         Fax   [phone]  History of Present Illness: This pleasant 79 year old woman is seen for a scheduled four-month followup office visit. She has a history of palpitations and history of labile hypertension. She has a history of anxiety and depression. Last autumn she was vacationing down at [place] and had chest pain. She was hospitalized overnight. She had a Lexus scan Myoview stress test which showed no ischemia and her ejection fraction was 87%. She had a two-dimensional echocardiogram which was normal and her ejection fraction was 70-75%. These tests were done on 03/19/12. At discharge she was told that she was anxious and she was placed on citalopram which has helped her.  Since last visit she has been doing well.  She has developed some left shoulder pain after lifting a heavy package.   Current Outpatient Prescriptions  Medication Sig Dispense Refill  . aspirin 81 MG tablet Take 81 mg by mouth daily.        . bisoprolol-hydrochlorothiazide (ZIAC) 2.5-6.25 MG per tablet Take 0.5 tablets by mouth daily. 1/2 daily  45 tablet  3  . citalopram (CELEXA) 10 MG tablet Take 10 mg by mouth daily.       . clorazepate (TRANXENE-T) 3.75 MG tablet 1 tablet every morning and evening and 1/2 -1 tablet as needed or as directed  90 tablet  5  . fluticasone (FLONASE) 50 MCG/ACT nasal spray Place 2 sprays into the nose at bedtime.       No current facility-administered medications for this visit.    Allergies  Allergen Reactions  . Amlodipine   . Penicillins Hives  . Shellfish Allergy Itching and Rash    Patient Active Problem List   Diagnosis Date Noted  . Anxiety 11/04/2010    Priority: High  . HYPERTENSION 09/24/2008    Priority: High  . Heart palpitations     Priority: Medium  . Bifascicular block 05/30/2012  . Vertigo 01/05/2011  .  DIVERTICULOSIS OF COLON 03/23/2010  . CONSTIPATION 01/29/2010  . HYPERLIPIDEMIA 09/24/2008  . DIVERTICULITIS, HX OF 09/24/2008    History  Smoking status  . Never Smoker   Smokeless tobacco  . Never Used    History  Alcohol Use No    Family History  Problem Relation Age of Onset  . Cancer Mother   . Diabetes Father     Review of Systems: Constitutional: no fever chills diaphoresis or fatigue or change in weight.  Head and neck: no hearing loss, no epistaxis, no photophobia or visual disturbance. Respiratory: No cough, shortness of breath or wheezing. Cardiovascular: No chest pain peripheral edema, palpitations. Gastrointestinal: No abdominal distention, no abdominal pain, no change in bowel habits hematochezia or melena. Genitourinary: No dysuria, no frequency, no urgency, no nocturia. Musculoskeletal:No arthralgias, no back pain, no gait disturbance or myalgias. Neurological: No dizziness, no headaches, no numbness, no seizures, no syncope, no weakness, no tremors. Hematologic: No lymphadenopathy, no easy bruising. Psychiatric: No confusion, no hallucinations, no sleep disturbance.    Physical Exam: Filed Vitals:   01/28/13 1051  BP: 148/80  Pulse: 72   the general appearance reveals a well-developed well-nourished woman in no distress.  She has difficulty elevating her left arm because of pain in the left shoulder.The head and neck exam reveals pupils equal and reactive.  Extraocular movements are full.  There is  no scleral icterus.  The mouth and pharynx are normal.  The neck is supple.  The carotids reveal no bruits.  The jugular venous pressure is normal.  The  thyroid is not enlarged.  There is no lymphadenopathy.  The chest is clear to percussion and auscultation.  There are no rales or rhonchi.  Expansion of the chest is symmetrical.  The precordium is quiet.  The first heart sound is normal.  The second heart sound is physiologically split.  There is no murmur  gallop rub or click.  There is no abnormal lift or heave.  The abdomen is soft and nontender.  The bowel sounds are normal.  The liver and spleen are not enlarged.  There are no abdominal masses.  There are no abdominal bruits.  Extremities reveal good pedal pulses.  There is no phlebitis or edema.  There is no cyanosis or clubbing.  Strength is normal and symmetrical in all extremities.  There is no lateralizing weakness.  There are no sensory deficits.  The skin is warm and dry.  There is no rash.     Assessment / Plan: Continue same medication.  Needs to lose weight to help her blood pressure.  Recheck in 4 months for office visit lipid panel hepatic function panel and basal metabolic panel.

## 2013-01-28 NOTE — Patient Instructions (Addendum)
Work harder on diet and weight loss  Your physician recommends that you continue on your current medications as directed. Please refer to the Current Medication list given to you today.  Your physician recommends that you schedule a follow-up appointment in: 4 months with fasting labs (lp/bmet/hfp)

## 2013-01-28 NOTE — Assessment & Plan Note (Signed)
The patient is having left shoulder pain which is musculoskeletal in origin.  It began after she lifted a heavy grocery package back in June and has not gotten any better.  I suggested that she see her orthopedic surgeon Dr. Darrelyn Hillock who has previously seen her for her spinal stenosis problems.  She may have a torn rotator cuff.

## 2013-01-28 NOTE — Assessment & Plan Note (Signed)
Patient has a history of hypercholesterolemia.  She will need to work harder on her weight.  She is not presently on any statin therapy.  We will plan to recheck her fasting lipids at her next visit in 4 months

## 2013-01-28 NOTE — Assessment & Plan Note (Signed)
Her systolic blood pressure has been running a little high at home.  She has been gaining weight unfortunately.  She states that she eats a lot of junk food.  She does not like to cook for one-person.  We talked about the importance of weight loss and helping to control her systolic hypertension.  She also has a history of whitecoat syndrome and anxiety.  We will keep her on her current dose of beta blocker.

## 2013-03-04 DIAGNOSIS — M25519 Pain in unspecified shoulder: Secondary | ICD-10-CM | POA: Diagnosis not present

## 2013-03-14 DIAGNOSIS — M25519 Pain in unspecified shoulder: Secondary | ICD-10-CM | POA: Diagnosis not present

## 2013-04-05 DIAGNOSIS — N390 Urinary tract infection, site not specified: Secondary | ICD-10-CM | POA: Diagnosis not present

## 2013-04-05 DIAGNOSIS — M545 Low back pain: Secondary | ICD-10-CM | POA: Diagnosis not present

## 2013-04-05 DIAGNOSIS — H6120 Impacted cerumen, unspecified ear: Secondary | ICD-10-CM | POA: Diagnosis not present

## 2013-04-06 ENCOUNTER — Emergency Department (HOSPITAL_COMMUNITY): Payer: Medicare Other

## 2013-04-06 ENCOUNTER — Emergency Department (HOSPITAL_COMMUNITY)
Admission: EM | Admit: 2013-04-06 | Discharge: 2013-04-06 | Disposition: A | Discharge status: Home / Self Care | Payer: Medicare Other | Attending: Emergency Medicine | Admitting: Emergency Medicine

## 2013-04-06 ENCOUNTER — Encounter (HOSPITAL_COMMUNITY): Payer: Self-pay | Admitting: Emergency Medicine

## 2013-04-06 DIAGNOSIS — M545 Low back pain, unspecified: Secondary | ICD-10-CM | POA: Insufficient documentation

## 2013-04-06 DIAGNOSIS — Z79899 Other long term (current) drug therapy: Secondary | ICD-10-CM | POA: Diagnosis not present

## 2013-04-06 DIAGNOSIS — Z87442 Personal history of urinary calculi: Secondary | ICD-10-CM | POA: Diagnosis not present

## 2013-04-06 DIAGNOSIS — R109 Unspecified abdominal pain: Secondary | ICD-10-CM

## 2013-04-06 DIAGNOSIS — R1031 Right lower quadrant pain: Secondary | ICD-10-CM | POA: Diagnosis not present

## 2013-04-06 DIAGNOSIS — F329 Major depressive disorder, single episode, unspecified: Secondary | ICD-10-CM | POA: Diagnosis not present

## 2013-04-06 DIAGNOSIS — F3289 Other specified depressive episodes: Secondary | ICD-10-CM | POA: Insufficient documentation

## 2013-04-06 DIAGNOSIS — Z8719 Personal history of other diseases of the digestive system: Secondary | ICD-10-CM | POA: Diagnosis not present

## 2013-04-06 DIAGNOSIS — M479 Spondylosis, unspecified: Secondary | ICD-10-CM | POA: Insufficient documentation

## 2013-04-06 DIAGNOSIS — I2510 Atherosclerotic heart disease of native coronary artery without angina pectoris: Secondary | ICD-10-CM | POA: Diagnosis not present

## 2013-04-06 DIAGNOSIS — I10 Essential (primary) hypertension: Secondary | ICD-10-CM | POA: Insufficient documentation

## 2013-04-06 DIAGNOSIS — R110 Nausea: Secondary | ICD-10-CM | POA: Diagnosis not present

## 2013-04-06 DIAGNOSIS — Z7982 Long term (current) use of aspirin: Secondary | ICD-10-CM | POA: Diagnosis not present

## 2013-04-06 DIAGNOSIS — R1011 Right upper quadrant pain: Secondary | ICD-10-CM | POA: Insufficient documentation

## 2013-04-06 DIAGNOSIS — Z862 Personal history of diseases of the blood and blood-forming organs and certain disorders involving the immune mechanism: Secondary | ICD-10-CM | POA: Diagnosis not present

## 2013-04-06 DIAGNOSIS — Z9861 Coronary angioplasty status: Secondary | ICD-10-CM | POA: Diagnosis not present

## 2013-04-06 DIAGNOSIS — M47817 Spondylosis without myelopathy or radiculopathy, lumbosacral region: Secondary | ICD-10-CM | POA: Diagnosis not present

## 2013-04-06 DIAGNOSIS — F411 Generalized anxiety disorder: Secondary | ICD-10-CM | POA: Insufficient documentation

## 2013-04-06 DIAGNOSIS — G43909 Migraine, unspecified, not intractable, without status migrainosus: Secondary | ICD-10-CM | POA: Diagnosis not present

## 2013-04-06 DIAGNOSIS — Z880 Allergy status to penicillin: Secondary | ICD-10-CM | POA: Diagnosis not present

## 2013-04-06 DIAGNOSIS — M549 Dorsalgia, unspecified: Secondary | ICD-10-CM

## 2013-04-06 DIAGNOSIS — Z8639 Personal history of other endocrine, nutritional and metabolic disease: Secondary | ICD-10-CM | POA: Insufficient documentation

## 2013-04-06 LAB — CBC WITH DIFFERENTIAL/PLATELET
Basophils Absolute: 0 10*3/uL (ref 0.0–0.1)
Basophils Relative: 0 % (ref 0–1)
Lymphocytes Relative: 36 % (ref 12–46)
MCHC: 35.1 g/dL (ref 30.0–36.0)
Monocytes Absolute: 0.6 10*3/uL (ref 0.1–1.0)
Neutro Abs: 4.5 10*3/uL (ref 1.7–7.7)
Neutrophils Relative %: 56 % (ref 43–77)
Platelets: 279 10*3/uL (ref 150–400)
RDW: 13.2 % (ref 11.5–15.5)
WBC: 8 10*3/uL (ref 4.0–10.5)

## 2013-04-06 LAB — COMPREHENSIVE METABOLIC PANEL
ALT: 12 U/L (ref 0–35)
AST: 17 U/L (ref 0–37)
Albumin: 4.1 g/dL (ref 3.5–5.2)
CO2: 24 mEq/L (ref 19–32)
Chloride: 99 mEq/L (ref 96–112)
Creatinine, Ser: 0.77 mg/dL (ref 0.50–1.10)
Potassium: 3.9 mEq/L (ref 3.5–5.1)
Sodium: 136 mEq/L (ref 135–145)
Total Bilirubin: 0.3 mg/dL (ref 0.3–1.2)

## 2013-04-06 LAB — URINALYSIS, ROUTINE W REFLEX MICROSCOPIC
Bilirubin Urine: NEGATIVE
Glucose, UA: NEGATIVE mg/dL
Hgb urine dipstick: NEGATIVE
Protein, ur: NEGATIVE mg/dL

## 2013-04-06 LAB — LIPASE, BLOOD: Lipase: 38 U/L (ref 11–59)

## 2013-04-06 MED ORDER — CIPROFLOXACIN IN D5W 400 MG/200ML IV SOLN
400.0000 mg | Freq: Two times a day (BID) | INTRAVENOUS | Status: DC
  Administered 2013-04-06: 400 mg via INTRAVENOUS
  Filled 2013-04-06: qty 200

## 2013-04-06 MED ORDER — IOHEXOL 300 MG/ML  SOLN
100.0000 mL | Freq: Once | INTRAMUSCULAR | Status: AC | PRN
  Administered 2013-04-06: 100 mL via INTRAVENOUS

## 2013-04-06 MED ORDER — ONDANSETRON HCL 4 MG/2ML IJ SOLN
4.0000 mg | Freq: Once | INTRAMUSCULAR | Status: AC
  Administered 2013-04-06: 4 mg via INTRAVENOUS
  Filled 2013-04-06: qty 2

## 2013-04-06 MED ORDER — IOHEXOL 300 MG/ML  SOLN
25.0000 mL | Freq: Once | INTRAMUSCULAR | Status: AC | PRN
  Administered 2013-04-06: 25 mL via ORAL

## 2013-04-06 MED ORDER — MORPHINE SULFATE 4 MG/ML IJ SOLN
4.0000 mg | Freq: Once | INTRAMUSCULAR | Status: AC
  Administered 2013-04-06: 4 mg via INTRAVENOUS
  Filled 2013-04-06: qty 1

## 2013-04-06 MED ORDER — SODIUM CHLORIDE 0.9 % IV SOLN
1000.0000 mL | INTRAVENOUS | Status: DC
  Administered 2013-04-06: 1000 mL via INTRAVENOUS

## 2013-04-06 MED ORDER — HYDROCODONE-ACETAMINOPHEN 5-325 MG PO TABS: 1.0000 | ORAL_TABLET | ORAL | Status: DC | PRN

## 2013-04-06 MED ORDER — SODIUM CHLORIDE 0.9 % IV SOLN
1000.0000 mL | Freq: Once | INTRAVENOUS | Status: AC
  Administered 2013-04-06: 1000 mL via INTRAVENOUS

## 2013-04-06 NOTE — ED Notes (Signed)
Reports having right side lower back pain, radiates around to RLQ and groin area, has been to md yesterday and started on bactrim with no relief. Denies urinary symptoms.

## 2013-04-06 NOTE — ED Notes (Signed)
CT notified that pt is finished with contrast.  

## 2013-04-06 NOTE — Progress Notes (Signed)
ANTIBIOTIC CONSULT NOTE - INITIAL  Pharmacy Consult for cipro Indication: cellulitis, hx of pseudomonal culture  Allergies  Allergen Reactions  . Amlodipine   . Penicillins Hives and Rash  . Shellfish Allergy Itching and Rash  . Shellfish-Derived Products Rash    Patient Measurements: Height: 5\' 3"  (160 cm) Weight: 162 lb 12.8 oz (73.846 kg) IBW/kg (Calculated) : 52.4 Adjusted Body Weight:   Vital Signs: Temp: 98.3 F (36.8 C) (10/18 1825) Temp src: Oral (10/18 1825) BP: 166/64 mmHg (10/18 1825) Pulse Rate: 69 (10/18 1825) Intake/Output from previous day:   Intake/Output from this shift:    Labs:  Recent Labs  04/06/13 1635  WBC 8.0  HGB 14.3  PLT 279  CREATININE 0.77   Estimated Creatinine Clearance: 51.3 ml/min (by C-G formula based on Cr of 0.77). No results found for this basename: VANCOTROUGH, VANCOPEAK, VANCORANDOM, GENTTROUGH, GENTPEAK, GENTRANDOM, TOBRATROUGH, TOBRAPEAK, TOBRARND, AMIKACINPEAK, AMIKACINTROU, AMIKACIN,  in the last 72 hours   Microbiology: No results found for this or any previous visit (from the past 720 hour(s)).  Medical History: Past Medical History  Diagnosis Date  . CONSTIPATION 01/29/2010  . DIVERTICULITIS, HX OF 09/24/2008  . DIVERTICULOSIS OF COLON 03/23/2010  . HYPERLIPIDEMIA 09/24/2008  . HYPERTENSION 09/24/2008  . Kidney stones   . Migraine   . Borderline diabetes   . Anxiety   . Heart palpitations   . Dyslipidemia     Statin intolerant  . Hypertension   . Palpitations     Essential  . Depression   . CAD (coronary artery disease)     Medications:  Scheduled:   Infusions:  . sodium chloride     Assessment: 79 yo female with cellulitis (hx of pseudomonal culture) will be started on cipro.  CrCl ~51.3   Plan:  1) Cipro 400mg  iv q12h.  2) f/u culture and sensitivity   Jama Mcmiller, Tsz-Yin 04/06/2013,7:05 PM

## 2013-04-06 NOTE — ED Provider Notes (Signed)
CSN: [id number]     Arrival date & time 04/06/13  1609 History   First MD Initiated Contact with Patient 04/06/13 1720     Chief Complaint  Patient presents with  . Back Pain  . Abdominal Pain    Patient is a 79 y.o. female presenting with back pain and abdominal pain.  Back Pain Location:  Lumbar spine Quality:  Aching (right sided) Radiates to: towards the right lower abdomen. Pain severity:  Moderate Onset quality:  Gradual Duration:  3 days Timing:  Constant Progression:  Worsening Chronicity:  New Context: not recent injury   Relieved by:  Nothing Associated symptoms: abdominal pain   Associated symptoms: no dysuria and no fever   Abdominal Pain Associated symptoms: nausea   Associated symptoms: no diarrhea, no dysuria, no fever and no vomiting   Pt saw her doctor yesterday and was started on antibiotics.  She does not have any urinary symptoms.  SHe was not given anything for pain yesteday and the symptoms seems to be getting worse.  Past Medical History  Diagnosis Date  . CONSTIPATION 01/29/2010  . DIVERTICULITIS, HX OF 09/24/2008  . DIVERTICULOSIS OF COLON 03/23/2010  . HYPERLIPIDEMIA 09/24/2008  . HYPERTENSION 09/24/2008  . Kidney stones   . Migraine   . Borderline diabetes   . Anxiety   . Heart palpitations   . Dyslipidemia     Statin intolerant  . Hypertension   . Palpitations     Essential  . Depression   . CAD (coronary artery disease)    Past Surgical History  Procedure Laterality Date  . Cardiac catheterization  1992    Minimal LAD disease  . Cardiovascular stress test  2008    No ischemia; EF 82%  . Cardiovascular stress test  2011    Negative   Family History  Problem Relation Age of Onset  . Cancer Mother   . Diabetes Father    History  Substance Use Topics  . Smoking status: Never Smoker   . Smokeless tobacco: Never Used  . Alcohol Use: No   OB History   Grav Para Term Preterm Abortions TAB SAB Ect Mult Living                 Review  of Systems  Constitutional: Negative for fever.  Gastrointestinal: Positive for nausea and abdominal pain. Negative for vomiting and diarrhea.  Genitourinary: Negative for dysuria.  Musculoskeletal: Positive for back pain.  All other systems reviewed and are negative.    Allergies  Amlodipine; Penicillins; Shellfish allergy; and Shellfish-derived products  Home Medications   Current Outpatient Rx  Name  Route  Sig  Dispense  Refill  . aspirin 81 MG tablet   Oral   Take 81 mg by mouth daily.           . bisoprolol-hydrochlorothiazide (ZIAC) 2.5-6.25 MG per tablet   Oral   Take 0.5 tablets by mouth daily. 1/2 daily   45 tablet   3   . citalopram (CELEXA) 10 MG tablet   Oral   Take 10 mg by mouth daily.          . clorazepate (TRANXENE-T) 3.75 MG tablet      1 tablet every morning and evening and 1/2 -1 tablet as needed or as directed   90 tablet   5   . fluticasone (FLONASE) 50 MCG/ACT nasal spray   Nasal   Place 2 sprays into the nose at bedtime.         Marland Kitchen  HYDROcodone-acetaminophen (NORCO/VICODIN) 5-325 MG per tablet   Oral   Take 1 tablet by mouth every 4 (four) hours as needed for pain.   16 tablet   0    BP 150/57  Pulse 65  Temp(Src) 98.3 F (36.8 C) (Oral)  Resp 18  Ht 5\' 3"  (1.6 m)  Wt 162 lb 12.8 oz (73.846 kg)  BMI 28.85 kg/m2  SpO2 97% Physical Exam  Nursing note and vitals reviewed. Constitutional: She appears well-developed and well-nourished. No distress.  HENT:  Head: Normocephalic and atraumatic.  Right Ear: External ear normal.  Left Ear: External ear normal.  Eyes: Conjunctivae are normal. Right eye exhibits no discharge. Left eye exhibits no discharge. No scleral icterus.  Neck: Neck supple. No tracheal deviation present.  Cardiovascular: Normal rate, regular rhythm and intact distal pulses.   Pulmonary/Chest: Effort normal and breath sounds normal. No stridor. No respiratory distress. She has no wheezes. She has no rales.   Abdominal: Soft. Bowel sounds are normal. She exhibits no distension. There is tenderness in the right upper quadrant and right lower quadrant. There is guarding and CVA tenderness (right sided). There is no rebound. No hernia.  Musculoskeletal: She exhibits no edema and no tenderness.  Neurological: She is alert. She has normal strength. No sensory deficit. Cranial nerve deficit:  no gross defecits noted. She exhibits normal muscle tone. She displays no seizure activity. Coordination normal.  Skin: Skin is warm and dry. No rash noted.  Psychiatric: She has a normal mood and affect.    ED Course  Procedures (including critical care time) Labs Review Labs Reviewed  COMPREHENSIVE METABOLIC PANEL - Abnormal; Notable for the following:    Glucose, Bld 104 (*)    GFR calc non Af Amer 76 (*)    GFR calc Af Amer 88 (*)    All other components within normal limits  CBC WITH DIFFERENTIAL  URINALYSIS, ROUTINE W REFLEX MICROSCOPIC  LIPASE, BLOOD   Imaging Review Ct Abdomen Pelvis W Contrast  04/06/2013   CLINICAL DATA:  Abdominal pain and back pain  EXAM: CT ABDOMEN AND PELVIS WITH CONTRAST  TECHNIQUE: Multidetector CT imaging of the abdomen and pelvis was performed using the standard protocol following bolus administration of intravenous contrast.  CONTRAST:  OMNIPAQUE IOHEXOL 300 MG/ML  SOLN  COMPARISON:  CT 12/18/2009  FINDINGS: Lung bases are clear. No pericardial fluid. Small benign appearing hypodense lesions in the liver unchanged. The gallbladder, pancreas, spleen, adrenal glands, and kidneys are unchanged from prior demonstrate no acute findings. There are bilateral nonenhancing cysts within the kidneys.  The stomach, small bowel, appendix, and cecum are normal. There are multiple diverticula scattered throughout the entirety of the colon but most dense within the descending colon sigmoid colon. There is no evidence of acute inflammation associated with the multiple diverticula.   Abdominal aorta is normal in caliber. No retroperitoneal or periportal lymphadenopathy. No free fluid the pelvis. The uterus contains a rounded mass is most suggestive of a leiomyoma. The adnexa are normal. Bladder is normal. Multiple levels of endplate spurring and facet hypertrophy within the thorax the lumbar spine. Grade 1 anterolisthesis of L4 on L5 without evidence of pars fracture.  IMPRESSION: 1.  No acute findings in the abdomen or pelvis.  2. Extensive colonic diverticulosis without evidence of acute diverticulitis  3. Marked degenerative disc disease and facet arthropathy in the lumbar spine.   Electronically Signed   By: Genevive Bi M.D.   On: 04/06/2013 21:53  EKG Interpretation   None       MDM   1. Abdominal pain   2. Back pain   3. Degenerative arthritis of spine    No definite UTI noted on the UA today.  Pt is already on po abx, sulfa.  CT scan without acute finding int he abdomen.  Pain may be related to her degenerative changes in the spine.  Will dc home on oral pain medications.    Celene Kras, MD 04/06/13 2204

## 2013-04-08 DIAGNOSIS — N390 Urinary tract infection, site not specified: Secondary | ICD-10-CM | POA: Diagnosis not present

## 2013-04-14 ENCOUNTER — Emergency Department (HOSPITAL_COMMUNITY)
Admission: EM | Admit: 2013-04-14 | Discharge: 2013-04-15 | Disposition: A | Discharge status: Home / Self Care | Payer: Medicare Other | Attending: Emergency Medicine | Admitting: Emergency Medicine

## 2013-04-14 ENCOUNTER — Encounter (HOSPITAL_COMMUNITY): Payer: Self-pay | Admitting: Emergency Medicine

## 2013-04-14 DIAGNOSIS — Z79899 Other long term (current) drug therapy: Secondary | ICD-10-CM | POA: Diagnosis not present

## 2013-04-14 DIAGNOSIS — I2510 Atherosclerotic heart disease of native coronary artery without angina pectoris: Secondary | ICD-10-CM | POA: Diagnosis not present

## 2013-04-14 DIAGNOSIS — Z862 Personal history of diseases of the blood and blood-forming organs and certain disorders involving the immune mechanism: Secondary | ICD-10-CM | POA: Diagnosis not present

## 2013-04-14 DIAGNOSIS — F329 Major depressive disorder, single episode, unspecified: Secondary | ICD-10-CM | POA: Insufficient documentation

## 2013-04-14 DIAGNOSIS — IMO0002 Reserved for concepts with insufficient information to code with codable children: Secondary | ICD-10-CM | POA: Insufficient documentation

## 2013-04-14 DIAGNOSIS — F411 Generalized anxiety disorder: Secondary | ICD-10-CM | POA: Diagnosis not present

## 2013-04-14 DIAGNOSIS — Z7982 Long term (current) use of aspirin: Secondary | ICD-10-CM | POA: Diagnosis not present

## 2013-04-14 DIAGNOSIS — R040 Epistaxis: Secondary | ICD-10-CM | POA: Diagnosis not present

## 2013-04-14 DIAGNOSIS — Z880 Allergy status to penicillin: Secondary | ICD-10-CM | POA: Insufficient documentation

## 2013-04-14 DIAGNOSIS — Z8719 Personal history of other diseases of the digestive system: Secondary | ICD-10-CM | POA: Insufficient documentation

## 2013-04-14 DIAGNOSIS — F3289 Other specified depressive episodes: Secondary | ICD-10-CM | POA: Insufficient documentation

## 2013-04-14 DIAGNOSIS — Z8639 Personal history of other endocrine, nutritional and metabolic disease: Secondary | ICD-10-CM | POA: Insufficient documentation

## 2013-04-14 DIAGNOSIS — Z87442 Personal history of urinary calculi: Secondary | ICD-10-CM | POA: Diagnosis not present

## 2013-04-14 DIAGNOSIS — I10 Essential (primary) hypertension: Secondary | ICD-10-CM | POA: Diagnosis not present

## 2013-04-14 MED ORDER — LIDOCAINE VISCOUS 2 % MT SOLN
15.0000 mL | Freq: Once | OROMUCOSAL | Status: AC
  Administered 2013-04-14: 15 mL via OROMUCOSAL
  Filled 2013-04-14: qty 15

## 2013-04-14 NOTE — ED Notes (Signed)
Patient with nosebleed since Thursday off and on.  Patient states she had the paramedics to the house on Thursday and they were able to get it stopped.  She has small amount of blood on Friday, yesterday and today has been worse.  She is holding pressure on nose at this time.

## 2013-04-14 NOTE — ED Provider Notes (Signed)
CSN: [id number]     Arrival date & time 04/14/13  2130 History   First MD Initiated Contact with Patient 04/14/13 2253     Chief Complaint  Patient presents with  . Epistaxis   (Consider location/radiation/quality/duration/timing/severity/associated sxs/prior Treatment) Patient is a 79 y.o. female presenting with nosebleeds. The history is provided by the patient.  Epistaxis Location:  L nare Severity:  Moderate Duration:  3 days Timing:  Intermittent Progression:  Unchanged Chronicity:  New Context: aspirin use and weather change   Context: not anticoagulants, not bleeding disorder and not home oxygen   Relieved by:  Applying pressure Associated symptoms: blood in oropharynx and congestion   Associated symptoms: no dizziness, no fever, no headaches and no syncope   Risk factors: allergies   Risk factors: no change in medication, no frequent nosebleeds, no recent nasal surgery and no sinus problems     Past Medical History  Diagnosis Date  . CONSTIPATION 01/29/2010  . DIVERTICULITIS, HX OF 09/24/2008  . DIVERTICULOSIS OF COLON 03/23/2010  . HYPERLIPIDEMIA 09/24/2008  . HYPERTENSION 09/24/2008  . Kidney stones   . Migraine   . Borderline diabetes   . Anxiety   . Heart palpitations   . Dyslipidemia     Statin intolerant  . Hypertension   . Palpitations     Essential  . Depression   . CAD (coronary artery disease)    Past Surgical History  Procedure Laterality Date  . Cardiac catheterization  1992    Minimal LAD disease  . Cardiovascular stress test  2008    No ischemia; EF 82%  . Cardiovascular stress test  2011    Negative   Family History  Problem Relation Age of Onset  . Cancer Mother   . Diabetes Father    History  Substance Use Topics  . Smoking status: Never Smoker   . Smokeless tobacco: Never Used  . Alcohol Use: No   OB History   Grav Para Term Preterm Abortions TAB SAB Ect Mult Living                 Review of Systems  Constitutional: Negative  for fever and chills.  HENT: Positive for congestion, nosebleeds and sinus pressure.   Respiratory: Negative for shortness of breath.   Cardiovascular: Negative for chest pain and syncope.  Gastrointestinal: Negative for nausea, vomiting and abdominal pain.  Neurological: Negative for dizziness, syncope and headaches.    Allergies  Amlodipine; Penicillins; Shellfish allergy; and Shellfish-derived products  Home Medications   Current Outpatient Rx  Name  Route  Sig  Dispense  Refill  . aspirin 81 MG tablet   Oral   Take 81 mg by mouth daily.           . bisoprolol-hydrochlorothiazide (ZIAC) 2.5-6.25 MG per tablet   Oral   Take 0.5 tablets by mouth daily.         . citalopram (CELEXA) 10 MG tablet   Oral   Take 10 mg by mouth daily.          . clorazepate (TRANXENE) 3.75 MG tablet   Oral   Take 1.875-3.75 mg by mouth See admin instructions. 1 tablet twice daily. Can take an additional 1/2-1 tablet as needed for anxiety         . fluticasone (FLONASE) 50 MCG/ACT nasal spray   Nasal   Place 2 sprays into the nose at bedtime.         Marland Kitchen HYDROcodone-acetaminophen (NORCO/VICODIN) 5-325 MG per  tablet   Oral   Take 1 tablet by mouth every 4 (four) hours as needed for pain.   16 tablet   0    BP 131/63  Pulse 91  Temp(Src) 98.2 F (36.8 C) (Oral)  Resp 20  SpO2 98% Physical Exam  Nursing note and vitals reviewed. Constitutional: She is oriented to person, place, and time. She appears well-developed and well-nourished. No distress.  HENT:  Head: Normocephalic and atraumatic.  Nose: Mucosal edema present. No nasal deformity, septal deviation or nasal septal hematoma. Epistaxis is observed.  Old blood in posterior oropharynx, streak fo blood down posterior oropharynx on left side  Eyes: Conjunctivae are normal. No scleral icterus.  Cardiovascular: Normal rate, regular rhythm and intact distal pulses.   Pulmonary/Chest: Effort normal. No respiratory distress.   Abdominal: Soft.  Neurological: She is alert and oriented to person, place, and time. Coordination normal.  Skin: Skin is warm and dry.  Psychiatric: She has a normal mood and affect.    ED Course  EPISTAXIS MANAGEMENT Date/Time: 04/14/2013 11:37 PM Performed by: Lear Ng. Authorized by: Lear Ng Consent: Verbal consent obtained. written consent not obtained. Risks and benefits: risks, benefits and alternatives were discussed Consent given by: patient Patient understanding: patient states understanding of the procedure being performed Patient consent: the patient's understanding of the procedure matches consent given Patient identity confirmed: verbally with patient Time out: Immediately prior to procedure a "time out" was called to verify the correct patient, procedure, equipment, support staff and site/side marked as required. Preparation: Patient was prepped and draped in the usual sterile fashion. Local anesthetic: topical anesthetic Anesthetic total: 1 ml Patient sedated: no Treatment site: left anterior Repair method: nasal balloon Post-procedure assessment: bleeding stopped Treatment complexity: simple Patient tolerance: Patient tolerated the procedure well with no immediate complications.   (including critical care time) Labs Review Labs Reviewed - No data to display Imaging Review No results found.  EKG Interpretation   None       ra sat is 98% and I interpret to be normal.    11:54 PM No SOB, bleeding seems to have stopped after placement of rapid rhino.  Will d/c home MDM   1. Anterior epistaxis      Pt with intermittent bleeding, has been on baby aspirin.  Continued slow bleeding despite holding pressure for a few hours.  Discussed packing to stop bleeding.  Pt sees Dr. Annalee Genta already.   I recommended close outpt follow up this week to remove packing in 2-3 days and re-check at that time.      Gavin Pound. Oletta Lamas, MD 04/14/13 1610

## 2013-04-14 NOTE — Discharge Instructions (Signed)
Nosebleed  Nosebleeds can be caused by many conditions including trauma, infections, polyps, foreign bodies, dry mucous membranes or climate, medications and air conditioning. Most nosebleeds occur in the front of the nose. It is because of this location that most nosebleeds can be controlled by pinching the nostrils gently and continuously. Do this for at least 10 to 20 minutes. The reason for this long continuous pressure is that you must hold it long enough for the blood to clot. If during that 10 to 20 minute time period, pressure is released, the process may have to be started again. The nosebleed may stop by itself, quit with pressure, need concentrated heating (cautery) or stop with pressure from packing.  HOME CARE INSTRUCTIONS    If your nose was packed, try to maintain the pack inside until your caregiver removes it. If a gauze pack was used and it starts to fall out, gently replace or cut the end off. Do not cut if a balloon catheter was used to pack the nose. Otherwise, do not remove unless instructed.   Avoid blowing your nose for 12 hours after treatment. This could dislodge the pack or clot and start bleeding again.   If the bleeding starts again, sit up and bending forward, gently pinch the front half of your nose continuously for 20 minutes.   If bleeding was caused by dry mucous membranes, cover the inside of your nose every morning with a petroleum or antibiotic ointment. Use your little fingertip as an applicator. Do this as needed during dry weather. This will keep the mucous membranes moist and allow them to heal.   Maintain humidity in your home by using less air conditioning or using a humidifier.   Do not use aspirin or medications which make bleeding more likely. Your caregiver can give you recommendations on this.   Resume normal activities as able but try to avoid straining, lifting or bending at the waist for several days.   If the nosebleeds become recurrent and the cause is  unknown, your caregiver may suggest laboratory tests.  SEEK IMMEDIATE MEDICAL CARE IF:    Bleeding recurs and cannot be controlled.   There is unusual bleeding from or bruising on other parts of the body.   You have a fever.   Nosebleeds continue.   There is any worsening of the condition which originally brought you in.   You become lightheaded, feel faint, become sweaty or vomit blood.  MAKE SURE YOU:    Understand these instructions.   Will watch your condition.   Will get help right away if you are not doing well or get worse.  Document Released: 03/16/2005 Document Revised: 08/29/2011 Document Reviewed: 05/08/2009  [employer] Patient Information 2014 [employer]

## 2013-04-15 DIAGNOSIS — J3489 Other specified disorders of nose and nasal sinuses: Secondary | ICD-10-CM | POA: Diagnosis not present

## 2013-04-15 DIAGNOSIS — J330 Polyp of nasal cavity: Secondary | ICD-10-CM | POA: Diagnosis not present

## 2013-04-15 DIAGNOSIS — J322 Chronic ethmoidal sinusitis: Secondary | ICD-10-CM | POA: Diagnosis not present

## 2013-04-15 DIAGNOSIS — J329 Chronic sinusitis, unspecified: Secondary | ICD-10-CM | POA: Diagnosis not present

## 2013-04-15 DIAGNOSIS — D140 Benign neoplasm of middle ear, nasal cavity and accessory sinuses: Secondary | ICD-10-CM | POA: Diagnosis not present

## 2013-04-15 MED ORDER — HYDROCODONE-ACETAMINOPHEN 5-325 MG PO TABS
1.0000 | ORAL_TABLET | Freq: Once | ORAL | Status: AC
  Administered 2013-04-15: 1 via ORAL
  Filled 2013-04-15: qty 1

## 2013-04-15 NOTE — ED Notes (Signed)
MD at bedside. 

## 2013-04-15 NOTE — ED Notes (Signed)
Rapid Rhino removed by Dr. Norlene Campbell. Rhino Rocket placed by Dr. Norlene Campbell

## 2013-04-15 NOTE — ED Notes (Signed)
Pt was walking to discharge window when pt's left nare started to bleed through the rhino rocket. Dr. Norlene Campbell notified

## 2013-04-15 NOTE — ED Provider Notes (Signed)
Pt just discharged by Dr Oletta Lamas, but had return of bleeding from left nare.  Rhino rocket removed, large merocel placed after patient blew nose and afrin applied.  Pt tolerated placement poorly, with c/o pain at eye.  Bleeding stopped.  Pt with return of bleeding, improved with pinching, will continue to monitor.  Pt with return of bleeding.  D/w Dr Jenne Pane who request removal of merocel, and replacement making sure to get entire packing into nare.  If she is still having bleeding, he will be in to see her.  Pt does not want replacement of merocel due to pain.  Some trickling after removal.  Pt will try larger rhinorocket, will also given aerosolized lidocaine with epi to help with pain.    7.5 rhinorocket place with minimal pain after lidocaine/epi mist.  No further bleeding.  Will d/c home to f/u with Dr Annalee Genta.    Lynn Mackie, MD 04/15/13 [phone]

## 2013-04-16 DIAGNOSIS — J342 Deviated nasal septum: Secondary | ICD-10-CM | POA: Diagnosis not present

## 2013-04-16 DIAGNOSIS — R040 Epistaxis: Secondary | ICD-10-CM | POA: Diagnosis not present

## 2013-04-16 DIAGNOSIS — J3489 Other specified disorders of nose and nasal sinuses: Secondary | ICD-10-CM | POA: Diagnosis not present

## 2013-04-18 ENCOUNTER — Telehealth: Payer: Self-pay | Admitting: Cardiology

## 2013-04-18 NOTE — Telephone Encounter (Signed)
Scheduled ov for tomorrow

## 2013-04-18 NOTE — Telephone Encounter (Signed)
New problem    Pt has a problem with bleeding from her nose and moth for a week    Last Tuesday  pt has Dr Emeline Darling, M did  surgery on her Nose 10/21 & Dr Annalee Genta 10/22  still bleeding then.   Last week pt has been having Palpitations.  Pt is feeling very bad today.  Pt stopped her asprin.  Pulse: 10/21   120   100 today.

## 2013-04-19 ENCOUNTER — Encounter: Payer: Self-pay | Admitting: Cardiology

## 2013-04-19 ENCOUNTER — Ambulatory Visit (INDEPENDENT_AMBULATORY_CARE_PROVIDER_SITE_OTHER): Payer: Medicare Other | Admitting: Cardiology

## 2013-04-19 VITALS — BP 150/62 | HR 75 | Ht 63.0 in | Wt 156.0 lb

## 2013-04-19 DIAGNOSIS — R002 Palpitations: Secondary | ICD-10-CM | POA: Diagnosis not present

## 2013-04-19 DIAGNOSIS — I10 Essential (primary) hypertension: Secondary | ICD-10-CM

## 2013-04-19 DIAGNOSIS — I119 Hypertensive heart disease without heart failure: Secondary | ICD-10-CM

## 2013-04-19 DIAGNOSIS — R040 Epistaxis: Secondary | ICD-10-CM | POA: Diagnosis not present

## 2013-04-19 DIAGNOSIS — F419 Anxiety disorder, unspecified: Secondary | ICD-10-CM

## 2013-04-19 DIAGNOSIS — I452 Bifascicular block: Secondary | ICD-10-CM

## 2013-04-19 DIAGNOSIS — F411 Generalized anxiety disorder: Secondary | ICD-10-CM

## 2013-04-19 LAB — CBC WITH DIFFERENTIAL/PLATELET
Basophils Relative: 0.1 % (ref 0.0–3.0)
Eosinophils Absolute: 0 10*3/uL (ref 0.0–0.7)
Hemoglobin: 9.5 g/dL — ABNORMAL LOW (ref 12.0–15.0)
Lymphocytes Relative: 18.5 % (ref 12.0–46.0)
MCHC: 33.8 g/dL (ref 30.0–36.0)
MCV: 86.3 fl (ref 78.0–100.0)
Monocytes Relative: 7.9 % (ref 3.0–12.0)
Neutro Abs: 7.4 10*3/uL (ref 1.4–7.7)
Neutrophils Relative %: 73.1 % (ref 43.0–77.0)
RBC: 3.27 Mil/uL — ABNORMAL LOW (ref 3.87–5.11)
WBC: 10.1 10*3/uL (ref 4.5–10.5)

## 2013-04-19 LAB — BASIC METABOLIC PANEL
BUN: 12 mg/dL (ref 6–23)
Chloride: 95 mEq/L — ABNORMAL LOW (ref 96–112)
Creatinine, Ser: 0.7 mg/dL (ref 0.4–1.2)
GFR: 86.27 mL/min (ref >60.00)
Potassium: 4.1 mEq/L (ref 3.5–5.1)
Sodium: 133 mEq/L — ABNORMAL LOW (ref 135–145)

## 2013-04-19 NOTE — Patient Instructions (Signed)
Will obtain labs today and call you with the results (cbc/bmet)  Your physician recommends that you continue on your current medications as directed. Please refer to the Current Medication list given to you today.  Your physician recommends that you schedule a follow-up appointment in: 3 months

## 2013-04-19 NOTE — Assessment & Plan Note (Signed)
She had successful surgery 3 days ago by Dr. Annalee Genta.  The nostril was repacked yesterday.  No sign of active bleeding now.  She does feel weak and she had prior profuse hemorrhage and we will check a hemoglobin.  She may need multiple vitamin or iron.

## 2013-04-19 NOTE — Assessment & Plan Note (Signed)
She has systolic hypertension will be secondary to the recent trauma of the prolonged epistaxis with resultant anemia.  She will continue current antihypertensive therapy

## 2013-04-19 NOTE — Assessment & Plan Note (Signed)
She remains very anxious and remains on Tranxene and citalopram

## 2013-04-19 NOTE — Progress Notes (Signed)
Lynn Walters Date of Birth:  [birth year] [address] [place], [place]  [address] [phone]         Fax   [phone]  History of Present Illness: This pleasant 79 year old woman is seen for a work in an office visit.  She comes in because of a recent problem with severe epistaxis.  She had successful surgery by Dr. Annalee Genta on her left nostril several days ago.  The wound was repacked yesterday.  She states that she bled profusely for about a week before the surgery was able to be performed.  She feels tired and weak.  She is concerned about anemia.  She feels that her heart is pounding when she walks.. She has a history of palpitations and history of labile hypertension. She has a history of anxiety and depression. Last autumn she was vacationing down at [place] and had chest pain. She was hospitalized overnight. She had a Lexus scan Myoview stress test which showed no ischemia and her ejection fraction was 87%. She had a two-dimensional echocardiogram which was normal and her ejection fraction was 70-75%. These tests were done on 03/19/12. At discharge she was told that she was anxious and she was placed on citalopram which has helped her.     Current Outpatient Prescriptions  Medication Sig Dispense Refill  . bisoprolol-hydrochlorothiazide (ZIAC) 2.5-6.25 MG per tablet Take 0.5 tablets by mouth daily.      . citalopram (CELEXA) 10 MG tablet Take 10 mg by mouth daily.       . clorazepate (TRANXENE) 3.75 MG tablet Take 1.875-3.75 mg by mouth See admin instructions. 1 tablet twice daily. Can take an additional 1/2-1 tablet as needed for anxiety       No current facility-administered medications for this visit.    Allergies  Allergen Reactions  . Amlodipine     Unknown. Thinks BP went up  . Penicillins Hives and Rash  . Shellfish Allergy Itching and Rash  . Shellfish-Derived Products Rash    Patient Active Problem List   Diagnosis Date Noted  . Anxiety  11/04/2010    Priority: High  . HYPERTENSION 09/24/2008    Priority: High  . Heart palpitations     Priority: Medium  . Severe epistaxis 04/19/2013  . Pain in left shoulder 01/28/2013  . Bifascicular block 05/30/2012  . Vertigo 01/05/2011  . DIVERTICULOSIS OF COLON 03/23/2010  . CONSTIPATION 01/29/2010  . HYPERLIPIDEMIA 09/24/2008  . DIVERTICULITIS, HX OF 09/24/2008    History  Smoking status  . Never Smoker   Smokeless tobacco  . Never Used    History  Alcohol Use No    Family History  Problem Relation Age of Onset  . Cancer Mother   . Diabetes Father     Review of Systems: Constitutional: no fever chills diaphoresis or fatigue or change in weight.  Head and neck: no hearing loss, no epistaxis, no photophobia or visual disturbance. Respiratory: No cough, shortness of breath or wheezing. Cardiovascular: No chest pain peripheral edema, palpitations. Gastrointestinal: No abdominal distention, no abdominal pain, no change in bowel habits hematochezia or melena. Genitourinary: No dysuria, no frequency, no urgency, no nocturia. Musculoskeletal:No arthralgias, no back pain, no gait disturbance or myalgias. Neurological: No dizziness, no headaches, no numbness, no seizures, no syncope, no weakness, no tremors. Hematologic: No lymphadenopathy, no easy bruising. Psychiatric: No confusion, no hallucinations, no sleep disturbance.    Physical Exam: Filed Vitals:   04/19/13 1147  BP: 150/62  Pulse:  75   the general appearance reveals a well-developed well-nourished woman in no distress.  She has difficulty elevating her left arm because of pain in the left shoulder.The head and neck exam reveals pupils equal and reactive.  The left nostril is packed.  Extraocular movements are full.  There is no scleral icterus.  The mouth and pharynx are normal.  The neck is supple.  The carotids reveal no bruits.  The jugular venous pressure is normal.  The  thyroid is not enlarged.  There  is no lymphadenopathy.  The chest is clear to percussion and auscultation.  There are no rales or rhonchi.  Expansion of the chest is symmetrical.  The precordium is quiet.  The first heart sound is normal.  The second heart sound is physiologically split.  There is no murmur gallop rub or click.  There is no abnormal lift or heave.  The abdomen is soft and nontender.  The bowel sounds are normal.  The liver and spleen are not enlarged.  There are no abdominal masses.  There are no abdominal bruits.  Extremities reveal good pedal pulses.  There is no phlebitis or edema.  There is no cyanosis or clubbing.  Strength is normal and symmetrical in all extremities.  There is no lateralizing weakness.  There are no sensory deficits.  The skin is warm and dry.  There is no rash.  EKG shows normal sinus rhythm with bifascicular block.  Since 05/30/12, the rate is faster   Assessment / Plan: Continue same medication.  Check CBC and basal metabolic panel today.  Recheck in 3 months.

## 2013-04-22 DIAGNOSIS — J342 Deviated nasal septum: Secondary | ICD-10-CM | POA: Diagnosis not present

## 2013-04-22 DIAGNOSIS — R040 Epistaxis: Secondary | ICD-10-CM | POA: Diagnosis not present

## 2013-04-22 DIAGNOSIS — J3489 Other specified disorders of nose and nasal sinuses: Secondary | ICD-10-CM | POA: Diagnosis not present

## 2013-05-08 DIAGNOSIS — R040 Epistaxis: Secondary | ICD-10-CM | POA: Diagnosis not present

## 2013-05-08 DIAGNOSIS — J310 Chronic rhinitis: Secondary | ICD-10-CM | POA: Diagnosis not present

## 2013-06-04 ENCOUNTER — Ambulatory Visit: Payer: Medicare Other | Admitting: Cardiovascular Disease

## 2013-06-04 ENCOUNTER — Other Ambulatory Visit: Payer: Medicare Other

## 2013-06-05 DIAGNOSIS — Z23 Encounter for immunization: Secondary | ICD-10-CM | POA: Diagnosis not present

## 2013-06-05 DIAGNOSIS — R5381 Other malaise: Secondary | ICD-10-CM | POA: Diagnosis not present

## 2013-06-05 DIAGNOSIS — R5383 Other fatigue: Secondary | ICD-10-CM | POA: Diagnosis not present

## 2013-06-05 DIAGNOSIS — F329 Major depressive disorder, single episode, unspecified: Secondary | ICD-10-CM | POA: Diagnosis not present

## 2013-06-05 DIAGNOSIS — D500 Iron deficiency anemia secondary to blood loss (chronic): Secondary | ICD-10-CM | POA: Diagnosis not present

## 2013-06-05 DIAGNOSIS — I10 Essential (primary) hypertension: Secondary | ICD-10-CM | POA: Diagnosis not present

## 2013-06-05 DIAGNOSIS — E559 Vitamin D deficiency, unspecified: Secondary | ICD-10-CM | POA: Diagnosis not present

## 2013-07-17 ENCOUNTER — Ambulatory Visit (INDEPENDENT_AMBULATORY_CARE_PROVIDER_SITE_OTHER): Payer: Medicare Other | Admitting: Cardiology

## 2013-07-17 ENCOUNTER — Encounter: Payer: Self-pay | Admitting: Cardiology

## 2013-07-17 VITALS — BP 160/80 | HR 76 | Ht 65.0 in | Wt 160.0 lb

## 2013-07-17 DIAGNOSIS — R002 Palpitations: Secondary | ICD-10-CM

## 2013-07-17 DIAGNOSIS — R040 Epistaxis: Secondary | ICD-10-CM | POA: Diagnosis not present

## 2013-07-17 DIAGNOSIS — I10 Essential (primary) hypertension: Secondary | ICD-10-CM | POA: Diagnosis not present

## 2013-07-17 DIAGNOSIS — I119 Hypertensive heart disease without heart failure: Secondary | ICD-10-CM | POA: Diagnosis not present

## 2013-07-17 DIAGNOSIS — Z862 Personal history of diseases of the blood and blood-forming organs and certain disorders involving the immune mechanism: Secondary | ICD-10-CM

## 2013-07-17 NOTE — Assessment & Plan Note (Signed)
Her blood pressure is high her and has been running high at home.  We will increase the strength of her bisoprolol HCTZ up to a full tablet for better blood pressure control

## 2013-07-17 NOTE — Assessment & Plan Note (Signed)
After her epistaxis her hemoglobin was down to 9.5 and she was placed on a multivitamin with iron.  She reports that her most recent hemoglobin done at her PCP office was back to normal.  She is also noted that she feels much stronger and it her fingernails have improved in strength is well since starting iron.  She is also going to start vitamin D as per her PCP Dr. Harrington Challenger.

## 2013-07-17 NOTE — Assessment & Plan Note (Signed)
No further epistaxis.  She will remain off aspirin

## 2013-07-17 NOTE — Progress Notes (Signed)
Lynn Walters Date of Birth:  04/18/1928 [address] [name], [place]  [address] [phone]         Fax   [phone]  History of Present Illness: This pleasant 80 year old woman is seen for a scheduled followup office visit.  s.. She has a history of palpitations and history of labile hypertension. She has a history of anxiety and depression.  In September 2013 she was vacationing down at [employer] and had chest pain. She was hospitalized overnight. She had a Lexus scan Myoview stress test which showed no ischemia and her ejection fraction was 87%. She had a two-dimensional echocardiogram which was normal and her ejection fraction was 70-75%. These tests were done on 03/19/12. At discharge she was told that she was anxious and she was placed on citalopram which has helped her.  We saw her as a work in visit in October 2014 after she had a problem with severe epistaxis.  She required nasal surgery by Dr. Wilburn Cornelia on her left nostril.  She has had no recurrent epistaxis.  She is no longer taking aspirin.   Current Outpatient Prescriptions  Medication Sig Dispense Refill  . bisoprolol-hydrochlorothiazide (ZIAC) 2.5-6.25 MG per tablet Take 1 tablet by mouth daily.       . citalopram (CELEXA) 10 MG tablet Take 10 mg by mouth. Take 1 1/2 tablets daily      . clorazepate (TRANXENE) 3.75 MG tablet Take 1.875-3.75 mg by mouth See admin instructions. 1 tablet twice daily. Can take an additional 1/2-1 tablet as needed for anxiety      . Multiple Vitamins-Iron (MULTIVITAMIN/IRON PO) Take by mouth daily.      . Vitamin D, Cholecalciferol, 1000 UNITS TABS Take by mouth daily.       No current facility-administered medications for this visit.    Allergies  Allergen Reactions  . Amlodipine     Unknown. Thinks BP went up  . Penicillins Hives and Rash  . Shellfish Allergy Itching and Rash  . Shellfish-Derived Products Rash    Patient Active Problem List   Diagnosis Date  Noted  . Anxiety 11/04/2010    Priority: High  . HYPERTENSION 09/24/2008    Priority: High  . Heart palpitations     Priority: Medium  . Severe epistaxis 04/19/2013  . Pain in left shoulder 01/28/2013  . Bifascicular block 05/30/2012  . Vertigo 01/05/2011  . DIVERTICULOSIS OF COLON 03/23/2010  . CONSTIPATION 01/29/2010  . HYPERLIPIDEMIA 09/24/2008  . DIVERTICULITIS, HX OF 09/24/2008    History  Smoking status  . Never Smoker   Smokeless tobacco  . Never Used    History  Alcohol Use No    Family History  Problem Relation Age of Onset  . Cancer Mother   . Diabetes Father     Review of Systems: Constitutional: no fever chills diaphoresis or fatigue or change in weight.  Head and neck: no hearing loss, no epistaxis, no photophobia or visual disturbance. Respiratory: No cough, shortness of breath or wheezing. Cardiovascular: No chest pain peripheral edema, palpitations. Gastrointestinal: No abdominal distention, no abdominal pain, no change in bowel habits hematochezia or melena. Genitourinary: No dysuria, no frequency, no urgency, no nocturia. Musculoskeletal:No arthralgias, no back pain, no gait disturbance or myalgias. Neurological: No dizziness, no headaches, no numbness, no seizures, no syncope, no weakness, no tremors. Hematologic: No lymphadenopathy, no easy bruising. Psychiatric: No confusion, no hallucinations, no sleep disturbance.    Physical Exam: Filed Vitals:  07/17/13 1125  BP: 160/80  Pulse: 76   the general appearance reveals a well-developed well-nourished woman in no distress.  She has difficulty elevating her left arm because of pain in the left shoulder.The head and neck exam reveals pupils equal and reactive.  The left nostril is packed.  Extraocular movements are full.  There is no scleral icterus.  The mouth and pharynx are normal.  The neck is supple.  The carotids reveal no bruits.  The jugular venous pressure is normal.  The  thyroid is not  enlarged.  There is no lymphadenopathy.  The chest is clear to percussion and auscultation.  There are no rales or rhonchi.  Expansion of the chest is symmetrical.  The precordium is quiet.  The first heart sound is normal.  The second heart sound is physiologically split.  There is no murmur gallop rub or click.  There is no abnormal lift or heave.  The abdomen is soft and nontender.  The bowel sounds are normal.  The liver and spleen are not enlarged.  There are no abdominal masses.  There are no abdominal bruits.  Extremities reveal good pedal pulses.  There is no phlebitis or edema.  There is no cyanosis or clubbing.  Strength is normal and symmetrical in all extremities.  There is no lateralizing weakness.  There are no sensory deficits.  The skin is warm and dry.  There is no rash.     Assessment / Plan: Increase bisoprolol HCT to a whole tablet daily.  Continue other medicines unchanged. Recheck in 3 months for office visit EKG and basal metabolic panel.

## 2013-07-17 NOTE — Patient Instructions (Signed)
INCREASE YOUR ZIAC TO A FULL TABLET DAILY  Your physician wants you to follow-up in: 3 MONTH OV/BMET/EKG You will receive a reminder letter in the mail two months in advance. If you don't receive a letter, please call our office to schedule the follow-up appointment.

## 2013-08-05 DIAGNOSIS — Z0000 Encounter for general adult medical examination without abnormal findings: Secondary | ICD-10-CM | POA: Diagnosis not present

## 2013-08-05 DIAGNOSIS — F329 Major depressive disorder, single episode, unspecified: Secondary | ICD-10-CM | POA: Diagnosis not present

## 2013-08-05 DIAGNOSIS — E785 Hyperlipidemia, unspecified: Secondary | ICD-10-CM | POA: Diagnosis not present

## 2013-08-05 DIAGNOSIS — Z23 Encounter for immunization: Secondary | ICD-10-CM | POA: Diagnosis not present

## 2013-08-05 DIAGNOSIS — E559 Vitamin D deficiency, unspecified: Secondary | ICD-10-CM | POA: Diagnosis not present

## 2013-08-05 DIAGNOSIS — I10 Essential (primary) hypertension: Secondary | ICD-10-CM | POA: Diagnosis not present

## 2013-08-09 ENCOUNTER — Other Ambulatory Visit: Payer: Self-pay | Admitting: Cardiology

## 2013-08-09 DIAGNOSIS — F419 Anxiety disorder, unspecified: Secondary | ICD-10-CM

## 2013-08-20 ENCOUNTER — Other Ambulatory Visit: Payer: Self-pay | Admitting: *Deleted

## 2013-08-20 MED ORDER — BISOPROLOL-HYDROCHLOROTHIAZIDE 2.5-6.25 MG PO TABS: 1.0000 | ORAL_TABLET | Freq: Every day | ORAL | Status: DC

## 2013-08-20 NOTE — Telephone Encounter (Signed)
Refilled medication as requested.

## 2013-08-21 DIAGNOSIS — I10 Essential (primary) hypertension: Secondary | ICD-10-CM | POA: Diagnosis not present

## 2013-08-21 DIAGNOSIS — E559 Vitamin D deficiency, unspecified: Secondary | ICD-10-CM | POA: Diagnosis not present

## 2013-08-21 DIAGNOSIS — R7301 Impaired fasting glucose: Secondary | ICD-10-CM | POA: Diagnosis not present

## 2013-08-21 DIAGNOSIS — E785 Hyperlipidemia, unspecified: Secondary | ICD-10-CM | POA: Diagnosis not present

## 2013-08-21 DIAGNOSIS — Z0000 Encounter for general adult medical examination without abnormal findings: Secondary | ICD-10-CM | POA: Diagnosis not present

## 2013-08-21 DIAGNOSIS — F329 Major depressive disorder, single episode, unspecified: Secondary | ICD-10-CM | POA: Diagnosis not present

## 2013-10-11 ENCOUNTER — Ambulatory Visit (INDEPENDENT_AMBULATORY_CARE_PROVIDER_SITE_OTHER): Payer: Medicare Other | Admitting: *Deleted

## 2013-10-11 DIAGNOSIS — E785 Hyperlipidemia, unspecified: Secondary | ICD-10-CM | POA: Diagnosis not present

## 2013-10-11 DIAGNOSIS — I10 Essential (primary) hypertension: Secondary | ICD-10-CM | POA: Diagnosis not present

## 2013-10-11 LAB — BASIC METABOLIC PANEL
BUN: 18 mg/dL (ref 6–23)
CO2: 28 mEq/L (ref 19–32)
CREATININE: 0.7 mg/dL (ref 0.4–1.2)
Calcium: 9.4 mg/dL (ref 8.4–10.5)
Chloride: 101 mEq/L (ref 96–112)
GFR: 79.49 mL/min (ref >60.00)
Glucose, Bld: 114 mg/dL — ABNORMAL HIGH (ref 70–99)
Potassium: 4.1 mEq/L (ref 3.5–5.1)
Sodium: 136 mEq/L (ref 135–145)

## 2013-10-13 NOTE — Progress Notes (Signed)
Quick Note:  Please make copy of labs for patient visit. ______ 

## 2013-10-15 ENCOUNTER — Encounter: Payer: Self-pay | Admitting: Cardiology

## 2013-10-15 ENCOUNTER — Ambulatory Visit (INDEPENDENT_AMBULATORY_CARE_PROVIDER_SITE_OTHER): Payer: Medicare Other | Admitting: Cardiology

## 2013-10-15 VITALS — BP 132/56 | HR 60 | Ht 65.0 in | Wt 162.0 lb

## 2013-10-15 DIAGNOSIS — I452 Bifascicular block: Secondary | ICD-10-CM | POA: Diagnosis not present

## 2013-10-15 DIAGNOSIS — E785 Hyperlipidemia, unspecified: Secondary | ICD-10-CM

## 2013-10-15 DIAGNOSIS — I10 Essential (primary) hypertension: Secondary | ICD-10-CM

## 2013-10-15 DIAGNOSIS — I119 Hypertensive heart disease without heart failure: Secondary | ICD-10-CM | POA: Diagnosis not present

## 2013-10-15 NOTE — Progress Notes (Signed)
Lynn Walters Date of Birth:  May 15, 1928 [facility] [address] [place], [place]  [address] [phone]        Fax   [phone]   History of Present Illness: This pleasant 80 year old woman is seen for a scheduled four-month followup office visit. She has a history of palpitations and history of labile hypertension. She has a history of anxiety and depression. Last autumn she was vacationing down at [employer] and had chest pain. She was hospitalized overnight. She had a Lexus scan Myoview stress test which showed no ischemia and her ejection fraction was 87%. She had a two-dimensional echocardiogram which was normal and her ejection fraction was 70-75%. These tests were done on 03/19/12. At discharge she was told that she was anxious and she was placed on citalopram which has helped her. Since last visit she has been doing well.  Since last visit she has had no new cardiac symptoms.   Current Outpatient Prescriptions  Medication Sig Dispense Refill  . bisoprolol-hydrochlorothiazide (ZIAC) 2.5-6.25 MG per tablet Take 1 tablet by mouth daily.  90 tablet  3  . citalopram (CELEXA) 10 MG tablet Take 10 mg by mouth. Take 1 1/2 tablets daily      . clorazepate (TRANXENE) 3.75 MG tablet TAKE 1 TABLET BY MOUTH EVERY MORNING AND EVERY EVENING, MAY TAKE ADDITONAL TABLET AS NEEDED MIDDAY  90 tablet  5  . Multiple Vitamins-Iron (MULTIVITAMIN/IRON PO) Take by mouth daily.      . Vitamin D, Cholecalciferol, 1000 UNITS TABS Take by mouth daily.       No current facility-administered medications for this visit.    Allergies  Allergen Reactions  . Amlodipine     Unknown. Thinks BP went up  . Penicillins Hives and Rash  . Shellfish Allergy Itching and Rash  . Shellfish-Derived Products Rash    Patient Active Problem List   Diagnosis Date Noted  . Anxiety 11/04/2010    Priority: High  . HYPERTENSION 09/24/2008    Priority: High  . Heart palpitations     Priority:  Medium  . H/O iron deficiency anemia 07/17/2013  . Severe epistaxis 04/19/2013  . Pain in left shoulder 01/28/2013  . Bifascicular block 05/30/2012  . Vertigo 01/05/2011  . DIVERTICULOSIS OF COLON 03/23/2010  . CONSTIPATION 01/29/2010  . HYPERLIPIDEMIA 09/24/2008  . DIVERTICULITIS, HX OF 09/24/2008    History  Smoking status  . Never Smoker   Smokeless tobacco  . Never Used    History  Alcohol Use No    Family History  Problem Relation Age of Onset  . Cancer Mother   . Diabetes Father     Review of Systems: Constitutional: no fever chills diaphoresis or fatigue or change in weight.  Head and neck: no hearing loss, no epistaxis, no photophobia or visual disturbance. Respiratory: No cough, shortness of breath or wheezing. Cardiovascular: No chest pain peripheral edema, palpitations. Gastrointestinal: No abdominal distention, no abdominal pain, no change in bowel habits hematochezia or melena. Genitourinary: No dysuria, no frequency, no urgency, no nocturia. Musculoskeletal:No arthralgias, no back pain, no gait disturbance or myalgias. Neurological: No dizziness, no headaches, no numbness, no seizures, no syncope, no weakness, no tremors. Hematologic: No lymphadenopathy, no easy bruising. Psychiatric: No confusion, no hallucinations, no sleep disturbance.    Physical Exam: Filed Vitals:   10/15/13 1014  BP: 132/56  Pulse: 60   the general appearance reveals a well-developed well-nourished woman in no distress .The head and  neck exam reveals pupils equal and reactive.  Extraocular movements are full.  There is no scleral icterus.  The mouth and pharynx are normal.  The neck is supple.  The carotids reveal no bruits.  The jugular venous pressure is normal.  The  thyroid is not enlarged.  There is no lymphadenopathy.  The chest is clear to percussion and auscultation.  There are no rales or rhonchi.  Expansion of the chest is symmetrical.  The precordium is quiet.  The first  heart sound is normal.  The second heart sound is physiologically split.  There is no murmur gallop rub or click.  There is no abnormal lift or heave.  The abdomen is soft and nontender.  The bowel sounds are normal.  The liver and spleen are not enlarged.  There are no abdominal masses.  There are no abdominal bruits.  Extremities reveal good pedal pulses.  There is no phlebitis or edema.  There is no cyanosis or clubbing.  Strength is normal and symmetrical in all extremities.  There is no lateralizing weakness.  There are no sensory deficits.  The skin is warm and dry.  There is no rash.  EKG shows normal sinus rhythm with right bundle branch block and left posterior fascicular block.  Since the last tracing of 04/19/13, the axis is more rightward.  Otherwise no significant change.  Assessment / Plan: 1. essential hypertension 2. bifascicular block, asymptomatic 3. anxiety and depression 4. past history of severe epistaxis  Plan: Continue on same medication.  Recheck in 3 months for followup office visit lipid panel hepatic function panel and basal metabolic panel.

## 2013-10-15 NOTE — Patient Instructions (Signed)
Your physician recommends that you continue on your current medications as directed. Please refer to the Current Medication list given to you today.  Your physician wants you to follow-up in: 3 months with fasting labs (LP/BMET/HFP)  You will receive a reminder letter in the mail two months in advance. If you don't receive a letter, please call our office to schedule the follow-up appointment.  

## 2013-11-07 ENCOUNTER — Other Ambulatory Visit: Payer: Self-pay | Admitting: *Deleted

## 2013-11-07 DIAGNOSIS — F419 Anxiety disorder, unspecified: Secondary | ICD-10-CM

## 2013-11-07 NOTE — Telephone Encounter (Signed)
Patient requests clorazepate refill be sent to [facility]. Thanks, MI

## 2013-11-08 MED ORDER — CLORAZEPATE DIPOTASSIUM 3.75 MG PO TABS: ORAL_TABLET | ORAL | Status: DC

## 2014-01-09 ENCOUNTER — Other Ambulatory Visit (INDEPENDENT_AMBULATORY_CARE_PROVIDER_SITE_OTHER): Payer: Medicare Other

## 2014-01-09 DIAGNOSIS — E785 Hyperlipidemia, unspecified: Secondary | ICD-10-CM

## 2014-01-09 LAB — BASIC METABOLIC PANEL
BUN: 17 mg/dL (ref 6–23)
CHLORIDE: 102 meq/L (ref 96–112)
CO2: 31 meq/L (ref 19–32)
Calcium: 9.3 mg/dL (ref 8.4–10.5)
Creatinine, Ser: 0.7 mg/dL (ref 0.4–1.2)
GFR: 87.58 mL/min (ref >60.00)
Glucose, Bld: 117 mg/dL — ABNORMAL HIGH (ref 70–99)
POTASSIUM: 4.2 meq/L (ref 3.5–5.1)
SODIUM: 137 meq/L (ref 135–145)

## 2014-01-09 LAB — HEPATIC FUNCTION PANEL
ALBUMIN: 3.8 g/dL (ref 3.5–5.2)
ALK PHOS: 53 U/L (ref 39–117)
ALT: 11 U/L (ref 0–35)
AST: 17 U/L (ref 0–37)
Bilirubin, Direct: 0 mg/dL (ref 0.0–0.3)
TOTAL PROTEIN: 7.7 g/dL (ref 6.0–8.3)
Total Bilirubin: 0.7 mg/dL (ref 0.2–1.2)

## 2014-01-09 LAB — LIPID PANEL
Cholesterol: 192 mg/dL (ref 0–200)
HDL: 38.3 mg/dL — ABNORMAL LOW (ref >39.00)
LDL Cholesterol: 129 mg/dL — ABNORMAL HIGH (ref 0–99)
NonHDL: 153.7
Total CHOL/HDL Ratio: 5
Triglycerides: 122 mg/dL (ref 0.0–149.0)
VLDL: 24.4 mg/dL (ref 0.0–40.0)

## 2014-01-09 NOTE — Progress Notes (Signed)
Quick Note:  Please make copy of labs for patient visit. ______ 

## 2014-01-14 ENCOUNTER — Encounter: Payer: Self-pay | Admitting: Cardiology

## 2014-01-14 ENCOUNTER — Ambulatory Visit (INDEPENDENT_AMBULATORY_CARE_PROVIDER_SITE_OTHER): Payer: Medicare Other | Admitting: Cardiology

## 2014-01-14 VITALS — BP 160/80 | HR 64 | Ht 63.0 in | Wt 160.0 lb

## 2014-01-14 DIAGNOSIS — E785 Hyperlipidemia, unspecified: Secondary | ICD-10-CM

## 2014-01-14 DIAGNOSIS — I119 Hypertensive heart disease without heart failure: Secondary | ICD-10-CM | POA: Diagnosis not present

## 2014-01-14 DIAGNOSIS — R002 Palpitations: Secondary | ICD-10-CM | POA: Diagnosis not present

## 2014-01-14 DIAGNOSIS — I452 Bifascicular block: Secondary | ICD-10-CM

## 2014-01-14 DIAGNOSIS — I10 Essential (primary) hypertension: Secondary | ICD-10-CM | POA: Diagnosis not present

## 2014-01-14 NOTE — Assessment & Plan Note (Signed)
We reviewed her recent lipid studies.  Her HDL is low.  We talked about the importance of regular exercise.  She is going to try to walk at [employer] to take advantage of the air conditioning.

## 2014-01-14 NOTE — Assessment & Plan Note (Signed)
Patient has a past history of palpitations.  She has not been experiencing any recent rapid heart action however.

## 2014-01-14 NOTE — Assessment & Plan Note (Signed)
Blood pressure today was initially high.  She overslept and was late for her office visit and she was worried about that.  By the end of her office visit her blood pressure was returning toward normal.  She states that at home her blood pressure has been normal.  She has had occasional mild ankle edema although she has none today.

## 2014-01-14 NOTE — Patient Instructions (Signed)
Your physician recommends that you schedule a follow-up appointment in: 3 month ov/ekg  Your physician recommends that you continue on your current medications as directed. Please refer to the Current Medication list given to you today.

## 2014-01-14 NOTE — Progress Notes (Signed)
Lynn Walters Date of Birth:  Feb 17, 1928 [facility] [address] [place], [place]  [address] [phone]        Fax   [phone]   History of Present Illness: This pleasant 80 year old woman is seen for a scheduled four-month followup office visit. She has a history of palpitations and history of labile hypertension. She has a history of anxiety and depression. Last autumn she was vacationing down at [employer] and had chest pain. She was hospitalized overnight. She had a Lexi scan Myoview stress test which showed no ischemia and her ejection fraction was 87%. She had a two-dimensional echocardiogram which was normal and her ejection fraction was 70-75%. These tests were done on 03/19/12. At discharge she was told that she was anxious and she was placed on citalopram which has helped her. Since last visit she has been doing well.  Since last visit she has had no new cardiac symptoms.  She continues to have labile blood pressure.  She reports that she was found to have a low vitamin K. level and recently her supplementation was increased.  She has had a past history of epistaxis but no recurrence since last visit.  She has not been getting much exercise because of fatigue during the day and she is afraid to walk alone at night.   Current Outpatient Prescriptions  Medication Sig Dispense Refill  . Saline (NASAL MOIST NA) Place into the nose as needed.      . Tetrahydrozoline HCl (EYE DROPS OP) Apply to eye as needed.      . Vitamin D, Cholecalciferol, 1000 UNITS TABS Take 4,000 Units by mouth daily.       . bisoprolol-hydrochlorothiazide (ZIAC) 2.5-6.25 MG per tablet Take 1 tablet by mouth daily.  90 tablet  3  . citalopram (CELEXA) 10 MG tablet Take 10 mg by mouth.       . clorazepate (TRANXENE) 3.75 MG tablet TAKE 1 TABLET BY MOUTH EVERY MORNING AND EVERY EVENING, MAY TAKE ADDITONAL TABLET AS NEEDED MIDDAY  90 tablet  5  . Multiple Vitamins-Iron (MULTIVITAMIN/IRON  PO) Take by mouth daily.       No current facility-administered medications for this visit.    Allergies  Allergen Reactions  . Amlodipine     Unknown. Thinks BP went up  . Penicillins Hives and Rash  . Shellfish Allergy Itching and Rash  . Shellfish-Derived Products Rash    Patient Active Problem List   Diagnosis Date Noted  . Anxiety 11/04/2010    Priority: High  . HYPERTENSION 09/24/2008    Priority: High  . Heart palpitations     Priority: Medium  . H/O iron deficiency anemia 07/17/2013  . Severe epistaxis 04/19/2013  . Pain in left shoulder 01/28/2013  . Bifascicular block 05/30/2012  . Vertigo 01/05/2011  . DIVERTICULOSIS OF COLON 03/23/2010  . CONSTIPATION 01/29/2010  . HYPERLIPIDEMIA 09/24/2008  . DIVERTICULITIS, HX OF 09/24/2008    History  Smoking status  . Never Smoker   Smokeless tobacco  . Never Used    History  Alcohol Use No    Family History  Problem Relation Age of Onset  . Cancer Mother   . Diabetes Father     Review of Systems: Constitutional: no fever chills diaphoresis or fatigue or change in weight.  Head and neck: no hearing loss, no epistaxis, no photophobia or visual disturbance. Respiratory: No cough, shortness of breath or wheezing. Cardiovascular: No chest pain peripheral edema,  palpitations. Gastrointestinal: No abdominal distention, no abdominal pain, no change in bowel habits hematochezia or melena. Genitourinary: No dysuria, no frequency, no urgency, no nocturia. Musculoskeletal:No arthralgias, no back pain, no gait disturbance or myalgias. Neurological: No dizziness, no headaches, no numbness, no seizures, no syncope, no weakness, no tremors. Hematologic: No lymphadenopathy, no easy bruising. Psychiatric: No confusion, no hallucinations, no sleep disturbance.    Physical Exam: Filed Vitals:   01/14/14 1145  BP: 160/80  Pulse: 64   the general appearance reveals a well-developed well-nourished woman in no distress  .The head and neck exam reveals pupils equal and reactive.  Extraocular movements are full.  There is no scleral icterus.  The mouth and pharynx are normal.  The neck is supple.  The carotids reveal no bruits.  The jugular venous pressure is normal.  The  thyroid is not enlarged.  There is no lymphadenopathy.  The chest is clear to percussion and auscultation.  There are no rales or rhonchi.  Expansion of the chest is symmetrical.  The precordium is quiet.  The first heart sound is normal.  The second heart sound is physiologically split.  There is no murmur gallop rub or click.  There is no abnormal lift or heave.  The abdomen is soft and nontender.  The bowel sounds are normal.  The liver and spleen are not enlarged.  There are no abdominal masses.  There are no abdominal bruits.  Extremities reveal good pedal pulses.  There is no phlebitis or edema.  There is no cyanosis or clubbing.  Strength is normal and symmetrical in all extremities.  There is no lateralizing weakness.  There are no sensory deficits.  The skin is warm and dry.  There is no rash.  EKG shows normal sinus rhythm with right bundle branch block and left posterior fascicular block.  Since the last tracing of 04/19/13, the axis is more rightward.  Otherwise no significant change.  Assessment / Plan: 1. essential hypertension 2. bifascicular block, asymptomatic 3. anxiety and depression 4. past history of severe epistaxis 5. palpitations related to anxiety  Plan: Continue on same medication.  Recheck in 3 months for followup office visit and EKG.

## 2014-04-11 ENCOUNTER — Ambulatory Visit (INDEPENDENT_AMBULATORY_CARE_PROVIDER_SITE_OTHER): Payer: Medicare Other | Admitting: Cardiology

## 2014-04-11 ENCOUNTER — Encounter: Payer: Self-pay | Admitting: Cardiology

## 2014-04-11 VITALS — BP 138/62 | HR 66 | Ht 64.0 in | Wt 164.0 lb

## 2014-04-11 DIAGNOSIS — I452 Bifascicular block: Secondary | ICD-10-CM

## 2014-04-11 DIAGNOSIS — F32A Depression, unspecified: Secondary | ICD-10-CM | POA: Insufficient documentation

## 2014-04-11 DIAGNOSIS — I10 Essential (primary) hypertension: Secondary | ICD-10-CM

## 2014-04-11 DIAGNOSIS — F329 Major depressive disorder, single episode, unspecified: Secondary | ICD-10-CM | POA: Insufficient documentation

## 2014-04-11 DIAGNOSIS — I119 Hypertensive heart disease without heart failure: Secondary | ICD-10-CM | POA: Diagnosis not present

## 2014-04-11 DIAGNOSIS — R002 Palpitations: Secondary | ICD-10-CM | POA: Diagnosis not present

## 2014-04-11 DIAGNOSIS — F419 Anxiety disorder, unspecified: Secondary | ICD-10-CM

## 2014-04-11 NOTE — Assessment & Plan Note (Signed)
The patient has not had any dizzy spells or syncope

## 2014-04-11 NOTE — Progress Notes (Signed)
[name] Date of Birth:  1927-11-27 [facility] [address] [place] [address] Lynn Walters, [place]  [address] [phone]        Fax   [phone]   History of Present Illness: This pleasant 80 year old woman is seen for a scheduled four-month followup office visit. She has a history of palpitations and history of labile hypertension. She has a history of anxiety and depression. Last autumn she was vacationing down at [employer] and had chest pain. She was hospitalized overnight. She had a Lexi scan Myoview stress test which showed no ischemia and her ejection fraction was 87%. She had a two-dimensional echocardiogram which was normal and her ejection fraction was 70-75%. These tests were done on 03/19/12. At discharge she was told that she was anxious and she was placed on citalopram which has helped her. Since last visit she has been doing well.  Since last visit she has had no new cardiac symptoms.  She continues to have labile blood pressure.  She reports that she was found to have a low vitamin K. level and recently her supplementation was increased.  She has had a past history of epistaxis but no recurrence since last visit.  She has not been getting much exercise because of fatigue during the day and she is afraid to walk alone at night. She lives by herself.  She still drives her car.  She is active in [employer] in her church.   Current Outpatient Prescriptions  Medication Sig Dispense Refill  . bisoprolol-hydrochlorothiazide (ZIAC) 2.5-6.25 MG per tablet Take 1 tablet by mouth daily.  90 tablet  3  . citalopram (CELEXA) 10 MG tablet Take 20 mg by mouth.       . clorazepate (TRANXENE) 3.75 MG tablet TAKE 1 TABLET BY MOUTH EVERY MORNING AND EVERY EVENING, MAY TAKE ADDITONAL TABLET AS NEEDED MIDDAY  90 tablet  5  . Multiple Vitamins-Iron (MULTIVITAMIN/IRON PO) Take 2,000 mg by mouth daily.       . Saline (NASAL MOIST NA) Place into the nose as needed.      .  Tetrahydrozoline HCl (EYE DROPS OP) Apply to eye as needed.      . Vitamin D, Cholecalciferol, 1000 UNITS TABS Take 4,000 Units by mouth daily.        No current facility-administered medications for this visit.    Allergies  Allergen Reactions  . Amlodipine     Unknown. Thinks BP went up  . Penicillins Hives and Rash  . Shellfish Allergy Itching and Rash  . Shellfish-Derived Products Rash    Patient Active Problem List   Diagnosis Date Noted  . Anxiety 11/04/2010    Priority: High  . HYPERTENSION 09/24/2008    Priority: High  . Heart palpitations     Priority: Medium  . H/O iron deficiency anemia 07/17/2013  . Severe epistaxis 04/19/2013  . Pain in left shoulder 01/28/2013  . Bifascicular block 05/30/2012  . Vertigo 01/05/2011  . DIVERTICULOSIS OF COLON 03/23/2010  . CONSTIPATION 01/29/2010  . HYPERLIPIDEMIA 09/24/2008  . DIVERTICULITIS, HX OF 09/24/2008    History  Smoking status  . Never Smoker   Smokeless tobacco  . Never Used    History  Alcohol Use No    Family History  Problem Relation Age of Onset  . Cancer Mother   . Diabetes Father     Review of Systems: Constitutional: no fever chills diaphoresis or fatigue or change in weight.  Head and neck: no  hearing loss, no epistaxis, no photophobia or visual disturbance. Respiratory: No cough, shortness of breath or wheezing. Cardiovascular: No chest pain peripheral edema, palpitations. Gastrointestinal: No abdominal distention, no abdominal pain, no change in bowel habits hematochezia or melena. Genitourinary: No dysuria, no frequency, no urgency, no nocturia. Musculoskeletal:No arthralgias, no back pain, no gait disturbance or myalgias. Neurological: No dizziness, no headaches, no numbness, no seizures, no syncope, no weakness, no tremors. Hematologic: No lymphadenopathy, no easy bruising. Psychiatric: No confusion, no hallucinations, no sleep disturbance.    Physical Exam: Filed Vitals:    04/11/14 0924  BP: 138/62  Pulse: 66   the general appearance reveals a well-developed well-nourished woman in no distress .The head and neck exam reveals pupils equal and reactive.  Extraocular movements are full.  There is no scleral icterus.  The mouth and pharynx are normal.  The neck is supple.  The carotids reveal no bruits.  The jugular venous pressure is normal.  The  thyroid is not enlarged.  There is no lymphadenopathy.  The chest is clear to percussion and auscultation.  There are no rales or rhonchi.  Expansion of the chest is symmetrical.  The precordium is quiet.  The first heart sound is normal.  The second heart sound is physiologically split.  There is no murmur gallop rub or click.  There is no abnormal lift or heave.  The abdomen is soft and nontender.  The bowel sounds are normal.  The liver and spleen are not enlarged.  There are no abdominal masses.  There are no abdominal bruits.  Extremities reveal good pedal pulses.  There is no phlebitis or edema.  There is no cyanosis or clubbing.  Strength is normal and symmetrical in all extremities.  There is no lateralizing weakness.  There are no sensory deficits.  The skin is warm and dry.  There is no rash.   Assessment / Plan: 1. essential hypertension 2. bifascicular block, asymptomatic 3. anxiety and depression 4. past history of severe epistaxis 5. palpitations related to anxiety  Plan: Continue on same medication.  Recheck in 4 months for followup office visit and EKG.

## 2014-04-11 NOTE — Assessment & Plan Note (Signed)
The patient is not having any exertional dyspnea or symptoms of CHF.  No chest pain

## 2014-04-11 NOTE — Assessment & Plan Note (Signed)
Patient appears to be doing better in regard to her depression on current dose of citalopram

## 2014-04-11 NOTE — Patient Instructions (Signed)
Your physician recommends that you continue on your current medications as directed. Please refer to the Current Medication list given to you today.  Your physician wants you to follow-up in: [place] will receive a reminder letter in the mail two months in advance. If you don't receive a letter, please call our office to schedule the follow-up appointment.

## 2014-05-28 DIAGNOSIS — J069 Acute upper respiratory infection, unspecified: Secondary | ICD-10-CM | POA: Diagnosis not present

## 2014-07-23 ENCOUNTER — Other Ambulatory Visit: Payer: Self-pay | Admitting: Cardiology

## 2014-07-23 DIAGNOSIS — F419 Anxiety disorder, unspecified: Secondary | ICD-10-CM

## 2014-08-11 ENCOUNTER — Other Ambulatory Visit: Payer: Self-pay | Admitting: Cardiology

## 2014-08-11 DIAGNOSIS — E559 Vitamin D deficiency, unspecified: Secondary | ICD-10-CM | POA: Diagnosis not present

## 2014-08-11 DIAGNOSIS — Z0000 Encounter for general adult medical examination without abnormal findings: Secondary | ICD-10-CM | POA: Diagnosis not present

## 2014-08-11 DIAGNOSIS — I10 Essential (primary) hypertension: Secondary | ICD-10-CM | POA: Diagnosis not present

## 2014-08-11 DIAGNOSIS — F329 Major depressive disorder, single episode, unspecified: Secondary | ICD-10-CM | POA: Diagnosis not present

## 2014-08-11 DIAGNOSIS — E784 Other hyperlipidemia: Secondary | ICD-10-CM | POA: Diagnosis not present

## 2014-08-18 ENCOUNTER — Ambulatory Visit (INDEPENDENT_AMBULATORY_CARE_PROVIDER_SITE_OTHER): Payer: Medicare Other | Admitting: Cardiology

## 2014-08-18 VITALS — BP 180/82 | HR 60 | Ht 62.0 in | Wt 165.4 lb

## 2014-08-18 DIAGNOSIS — R002 Palpitations: Secondary | ICD-10-CM | POA: Diagnosis not present

## 2014-08-18 DIAGNOSIS — I452 Bifascicular block: Secondary | ICD-10-CM | POA: Diagnosis not present

## 2014-08-18 DIAGNOSIS — I119 Hypertensive heart disease without heart failure: Secondary | ICD-10-CM

## 2014-08-18 DIAGNOSIS — F419 Anxiety disorder, unspecified: Secondary | ICD-10-CM | POA: Diagnosis not present

## 2014-08-18 NOTE — Progress Notes (Signed)
Cardiology Office Note   Date:  08/18/2014   ID:  Lynn, Walters March 03, 1928, MRN [id number]  PCP:   Melinda Crutch, MD  Cardiologist:   Darlin Coco, MD   No chief complaint on file.     History of Present Illness: Lynn Walters is a 81 y.o. female who presents for scheduled follow-up office visit. This pleasant 81 year old woman is seen for a scheduled four-month followup office visit. She has a history of palpitations and history of labile hypertension. She has a history of anxiety and depression.  Since last visit her depression has been worse and her PCP has increased her citalopram dose. Last autumn she was vacationing down at [employer] and had chest pain. She was hospitalized overnight. She had a Lexi scan Myoview stress test which showed no ischemia and her ejection fraction was 87%. She had a two-dimensional echocardiogram which was normal and her ejection fraction was 70-75%. These tests were done on 03/19/12. At discharge she was told that she was anxious and she was placed on citalopram which has helped her. Since last visit she has been doing well. Since last visit she has had no new cardiac symptoms. She continues to have labile blood pressure. She reports that she was found to have a low vitamin K. level and recently her supplementation was increased. She has had a past history of epistaxis but no recurrence since last visit. She has not been getting much exercise because of fatigue during the day and she is afraid to walk alone at night. She lives by herself. She still drives her car. She is active in [employer] groups in her church. Here in the office today her blood pressure was higher than usual.  She attributes this to stress.  She will monitor it closely at home.   Past Medical History  Diagnosis Date  . CONSTIPATION 01/29/2010  . DIVERTICULITIS, HX OF 09/24/2008  . DIVERTICULOSIS OF COLON 03/23/2010  . HYPERLIPIDEMIA 09/24/2008  . HYPERTENSION 09/24/2008  .  Kidney stones   . Migraine   . Borderline diabetes   . Anxiety   . Heart palpitations   . Dyslipidemia     Statin intolerant  . Hypertension   . Palpitations     Essential  . Depression   . CAD (coronary artery disease)     Past Surgical History  Procedure Laterality Date  . Cardiac catheterization  1992    Minimal LAD disease  . Cardiovascular stress test  2008    No ischemia; EF 82%  . Cardiovascular stress test  2011    Negative     Current Outpatient Prescriptions  Medication Sig Dispense Refill  . bisoprolol-hydrochlorothiazide (ZIAC) 2.5-6.25 MG per tablet TAKE 1 TABLET BY MOUTH DAILY 90 tablet 0  . citalopram (CELEXA) 10 MG tablet Take 20 mg by mouth.     . clorazepate (TRANXENE) 3.75 MG tablet TAKE 1 TABLET BY MOUTH EVERY MORNING AND EVERY EVENING. MAY TAKE ADDITIONAL 1 TABLET MID-DAY AS NEEDED 90 tablet 5  . Multiple Vitamins-Iron (MULTIVITAMIN/IRON PO) Take by mouth daily.     . Saline (NASAL MOIST NA) Place into the nose as needed.    . Tetrahydrozoline HCl (EYE DROPS OP) Apply to eye as needed.    . Vitamin D, Cholecalciferol, 1000 UNITS TABS Take 2,000 Units by mouth daily.      No current facility-administered medications for this visit.    Allergies:   Amlodipine; Penicillins; Shellfish allergy; and Shellfish-derived products  Social History:  The patient  reports that she has never smoked. She has never used smokeless tobacco. She reports that she does not drink alcohol or use illicit drugs.   Family History:  The patient's family history includes Cancer in her mother; Diabetes in her father.    ROS:  Please see the history of present illness.   Otherwise, review of systems are positive for none.   All other systems are reviewed and negative.    PHYSICAL EXAM: VS:  BP 180/82 mmHg  Pulse 60  Ht 5\' 2"  (1.575 m)  Wt 165 lb 6.4 oz (75.025 kg)  BMI 30.24 kg/m2 , BMI Body mass index is 30.24 kg/(m^2). GEN: Well nourished, well developed, in no acute  distress HEENT: normal Neck: no JVD, carotid bruits, or masses Cardiac: RRR; no murmurs, rubs, or gallops,no edema  Respiratory:  clear to auscultation bilaterally, normal work of breathing GI: soft, nontender, nondistended, + BS MS: no deformity or atrophy Skin: warm and dry, no rash Neuro:  Strength and sensation are intact Psych: euthymic mood, full affect   EKG:  EKG is ordered today. The ekg ordered today demonstrates normal sinus rhythm and bifascicular block and is unchanged since 10/15/13   Recent Labs: 01/09/2014: ALT 11; BUN 17; Creatinine 0.7; Potassium 4.2; Sodium 137    Lipid Panel    Component Value Date/Time   CHOL 192 01/09/2014 1013   TRIG 122.0 01/09/2014 1013   HDL 38.30* 01/09/2014 1013   CHOLHDL 5 01/09/2014 1013   VLDL 24.4 01/09/2014 1013   LDLCALC 129* 01/09/2014 1013      Wt Readings from Last 3 Encounters:  08/18/14 165 lb 6.4 oz (75.025 kg)  04/11/14 164 lb (74.39 kg)  01/14/14 160 lb (72.576 kg)        ASSESSMENT AND PLAN:  1. essential hypertension 2. bifascicular block, asymptomatic 3. anxiety and depression 4. past history of severe epistaxis 5. palpitations related to anxiety  Plan: Continue on same medication. Recheck in 4 months for followup office visit and EKG.   Current medicines are reviewed at length with the patient today.  The patient does not have concerns regarding medicines.  The following changes have been made:  no change    Orders Placed This Encounter  Procedures  . EKG 12-Lead     Disposition:   FU with Dr. Mare Ferrari in 4 months   Signed, Darlin Coco, MD  08/18/2014 1:28 PM    [facility] [place], [place], [place]  [address] Phone: [phone]; Fax: [phone]

## 2014-08-18 NOTE — Patient Instructions (Signed)
Your physician recommends that you continue on your current medications as directed. Please refer to the Current Medication list given to you today.  Your physician recommends that you schedule a follow-up appointment in: 4 month ov  

## 2014-09-18 ENCOUNTER — Telehealth: Payer: Self-pay | Admitting: Cardiology

## 2014-09-18 MED ORDER — BISOPROLOL-HYDROCHLOROTHIAZIDE 2.5-6.25 MG PO TABS: 1.0000 | ORAL_TABLET | Freq: Two times a day (BID) | ORAL | Status: DC

## 2014-09-18 NOTE — Telephone Encounter (Signed)
Advised patient, verbalized understanding  

## 2014-09-18 NOTE — Telephone Encounter (Signed)
Increase ziac to BID to help pulse and also BP

## 2014-09-18 NOTE — Telephone Encounter (Signed)
Patient started having a headache on Sunday and when she woke up Monday morning blood pressure 182/80 HR 103 Patient had not taken any medications, about 1-2 hours later blood pressure ok but heart rate 90 Heart rate has been running 90-100's since This am prior to meds blood pressure 149/78 HR 109 At 2:10 pm blood pressure 142/68 HR 93  Patient is very concerned about her elevated heart rate Will forward to  Dr. Mare Ferrari for review

## 2014-09-18 NOTE — Telephone Encounter (Signed)
New message         Pt would like Rip Harbour to give her a call

## 2014-10-28 DIAGNOSIS — J209 Acute bronchitis, unspecified: Secondary | ICD-10-CM | POA: Diagnosis not present

## 2014-10-30 DIAGNOSIS — R0989 Other specified symptoms and signs involving the circulatory and respiratory systems: Secondary | ICD-10-CM | POA: Diagnosis not present

## 2014-10-30 DIAGNOSIS — J189 Pneumonia, unspecified organism: Secondary | ICD-10-CM | POA: Diagnosis not present

## 2014-11-13 DIAGNOSIS — J329 Chronic sinusitis, unspecified: Secondary | ICD-10-CM | POA: Diagnosis not present

## 2014-11-13 DIAGNOSIS — B379 Candidiasis, unspecified: Secondary | ICD-10-CM | POA: Diagnosis not present

## 2014-12-12 ENCOUNTER — Ambulatory Visit (INDEPENDENT_AMBULATORY_CARE_PROVIDER_SITE_OTHER): Payer: Medicare Other | Admitting: Cardiology

## 2014-12-12 ENCOUNTER — Encounter: Payer: Self-pay | Admitting: Cardiology

## 2014-12-12 VITALS — BP 130/60 | HR 65 | Ht 65.0 in | Wt 166.8 lb

## 2014-12-12 DIAGNOSIS — I119 Hypertensive heart disease without heart failure: Secondary | ICD-10-CM | POA: Diagnosis not present

## 2014-12-12 DIAGNOSIS — F329 Major depressive disorder, single episode, unspecified: Secondary | ICD-10-CM

## 2014-12-12 DIAGNOSIS — F32A Depression, unspecified: Secondary | ICD-10-CM

## 2014-12-12 DIAGNOSIS — I452 Bifascicular block: Secondary | ICD-10-CM

## 2014-12-12 DIAGNOSIS — R002 Palpitations: Secondary | ICD-10-CM | POA: Diagnosis not present

## 2014-12-12 NOTE — Patient Instructions (Signed)
Medication Instructions:  Your physician recommends that you continue on your current medications as directed. Please refer to the Current Medication list given to you today.  Labwork: NONE  Testing/Procedures: NONE  Follow-Up: Your physician wants you to follow-up in: 4 MONTH OV/CBC/BMET You will receive a reminder letter in the mail two months in advance. If you don't receive a letter, please call our office to schedule the follow-up appointment.

## 2014-12-12 NOTE — Progress Notes (Signed)
Cardiology Office Note   Date:  12/12/2014   ID:  Lynn Walters, DOB 07/01/1927, MRN [id number]  PCP:   Melinda Crutch, MD  Cardiologist: Darlin Coco MD  No chief complaint on file.     History of Present Illness: Lynn Walters is a 81 y.o. female who presents for a four-month follow-up office visit   She has a history of palpitations and history of labile hypertension. She has a history of anxiety and depression. Since last visit her depression has been worse and her PCP has increased her citalopram dose. Last autumn she was vacationing down at [employer] and had chest pain. She was hospitalized overnight. She had a Lexi scan Myoview stress test which showed no ischemia and her ejection fraction was 87%. She had a two-dimensional echocardiogram which was normal and her ejection fraction was 70-75%. These tests were done on 03/19/12. At discharge she was told that she was anxious and she was placed on citalopram which has helped her. Since last visit she has been doing well. Since last visit she has had no new cardiac symptoms. She did have a mechanical fall on her front porch.  Fortunately she did not break any bones. She felt that her heart appeared to be racing at night.  On her own she began splitting her Ziac into half and taking a half in the morning and half at night and since then her blood pressure has been stable and her palpitations have been abolished. She lives by herself. She still drives her car. She is active in [employer] in her church.   Past Medical History  Diagnosis Date  . CONSTIPATION 01/29/2010  . DIVERTICULITIS, HX OF 09/24/2008  . DIVERTICULOSIS OF COLON 03/23/2010  . HYPERLIPIDEMIA 09/24/2008  . HYPERTENSION 09/24/2008  . Kidney stones   . Migraine   . Borderline diabetes   . Anxiety   . Heart palpitations   . Dyslipidemia     Statin intolerant  . Hypertension   . Palpitations     Essential  . Depression   . CAD (coronary artery disease)      Past Surgical History  Procedure Laterality Date  . Cardiac catheterization  1992    Minimal LAD disease  . Cardiovascular stress test  2008    No ischemia; EF 82%  . Cardiovascular stress test  2011    Negative     Current Outpatient Prescriptions  Medication Sig Dispense Refill  . bisoprolol-hydrochlorothiazide (ZIAC) 2.5-6.25 MG per tablet Take 1 tablet by mouth 2 (two) times daily. 180 tablet 1  . Cholecalciferol (VITAMIN D) 2000 UNITS tablet Take 4,000 Units by mouth daily.    . citalopram (CELEXA) 40 MG tablet Take 40 mg by mouth daily.  4  . clorazepate (TRANXENE) 3.75 MG tablet Take 3.75 mg by mouth 2 (two) times daily. May take additional 1 tablet by mouth mid-day as needed for anxiety    . Multiple Vitamins-Iron (MULTIVITAMIN/IRON PO) Take 1 tablet by mouth daily.     . Saline (NASAL MOIST NA) Place 1 Squirt into both nostrils as needed (nasal dryness).     . Tetrahydrozoline HCl (EYE DROPS OP) Place 1 drop into both eyes as needed (dry eye).     . Vitamin D, Cholecalciferol, 1000 UNITS TABS Take 2,000 Units by mouth daily.      No current facility-administered medications for this visit.    Allergies:   Amlodipine; Penicillins; Shellfish allergy; and Shellfish-derived products  Social History:  The patient  reports that she has never smoked. She has never used smokeless tobacco. She reports that she does not drink alcohol or use illicit drugs.   Family History:  The patient's family history includes Cancer in her mother; Diabetes in her father.    ROS:  Please see the history of present illness.   Otherwise, review of systems are positive for none.   All other systems are reviewed and negative.    PHYSICAL EXAM: VS:  BP 130/60 mmHg  Pulse 65  Ht 5\' 5"  (1.651 m)  Wt 166 lb 12.8 oz (75.66 kg)  BMI 27.76 kg/m2 , BMI Body mass index is 27.76 kg/(m^2). GEN: Well nourished, well developed, in no acute distress HEENT: normal Neck: no JVD, carotid bruits, or  masses Cardiac: RRR; no murmurs, rubs, or gallops,no edema  Respiratory:  clear to auscultation bilaterally, normal work of breathing GI: soft, nontender, nondistended, + BS MS: no deformity or atrophy Skin: warm and dry, no rash Neuro:  Strength and sensation are intact Psych: euthymic mood, full affect   EKG:  EKG is not ordered today.     Recent Labs: 01/09/2014: ALT 11; BUN 17; Creatinine, Ser 0.7; Potassium 4.2; Sodium 137    Lipid Panel    Component Value Date/Time   CHOL 192 01/09/2014 1013   TRIG 122.0 01/09/2014 1013   HDL 38.30* 01/09/2014 1013   CHOLHDL 5 01/09/2014 1013   VLDL 24.4 01/09/2014 1013   LDLCALC 129* 01/09/2014 1013      Wt Readings from Last 3 Encounters:  12/12/14 166 lb 12.8 oz (75.66 kg)  08/18/14 165 lb 6.4 oz (75.025 kg)  04/11/14 164 lb (74.39 kg)        ASSESSMENT AND PLAN:  1. essential hypertension 2. bifascicular block, asymptomatic 3. anxiety and depression 4. past history of severe epistaxis 5. palpitations related to anxiety  Plan: Continue on same medication. Recheck in 4 months for followup office visit CBC and basal metabolic panel   Current medicines are reviewed at length with the patient today.  The patient does not have concerns regarding medicines.  The following changes have been made:  no change  Labs/ tests ordered today include:  No orders of the defined types were placed in this encounter.    Berna Spare MD 12/12/2014 1:22 PM    [facility] [address], [place], [place]  [address] Phone: [phone]; Fax: [phone]

## 2015-01-19 IMAGING — CT CT ABD-PELV W/ CM
2 of 5 series · 16 of 46 positions shown, 18 images · IV contrast (APPLIED)
Comparison: CT 12/18/2009

CLINICAL DATA: Abdominal pain and back pain

EXAM:
CT ABDOMEN AND PELVIS WITH CONTRAST
TECHNIQUE: Multidetector CT imaging of the abdomen and pelvis was performed
using the standard protocol following bolus administration of
intravenous contrast.
CONTRAST:  100mL OMNIPAQUE IOHEXOL 300 MG/ML  SOLN

[Series 2: abd/ pelvis 5.0 i30f 1 · axial · 0.67mm/px · z∈[+201,+591]mm · 13 of 88 slices shown, 15 images]
[im 5/88  soft-tissue]
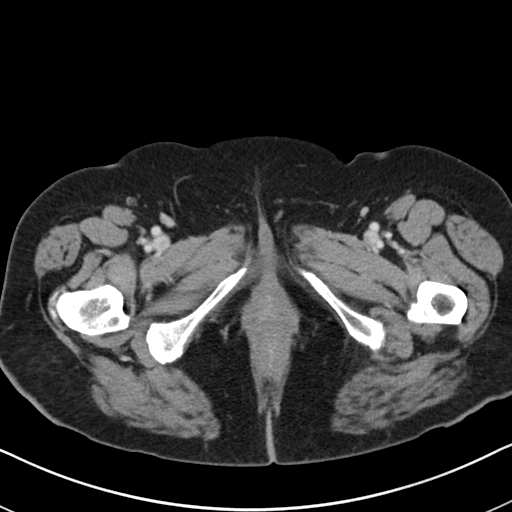
[im 5/88  bone]
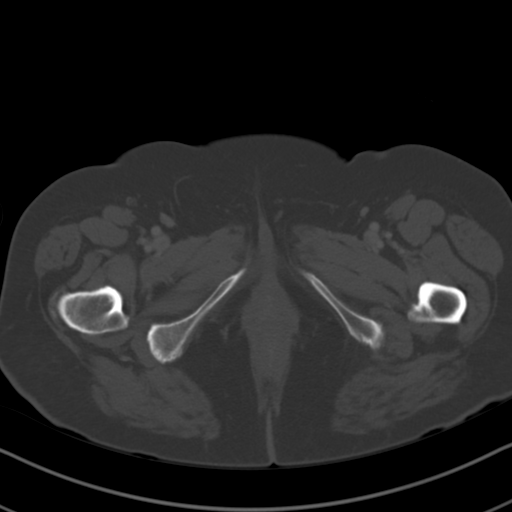
[im 14/88  soft-tissue]
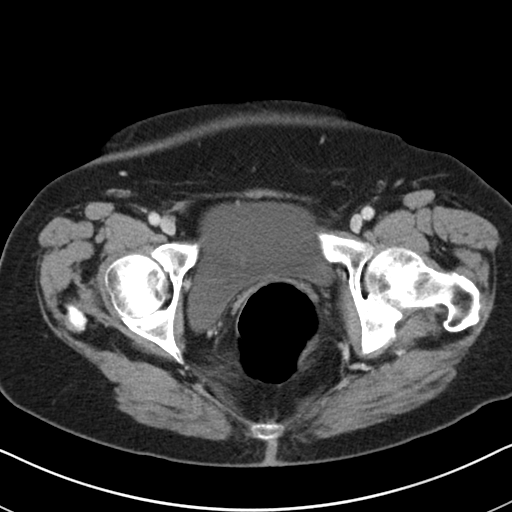
[im 18/88  soft-tissue]
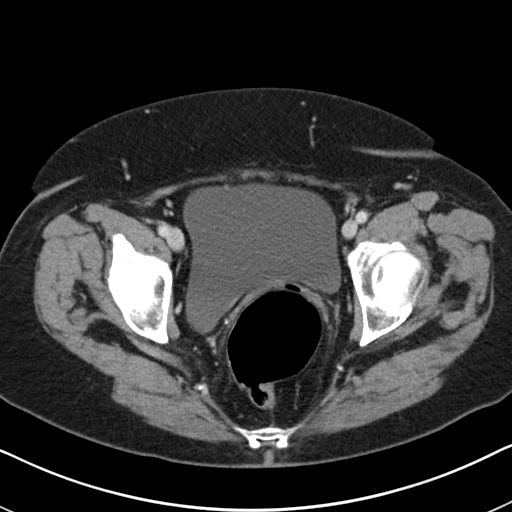
[im 27/88  soft-tissue]
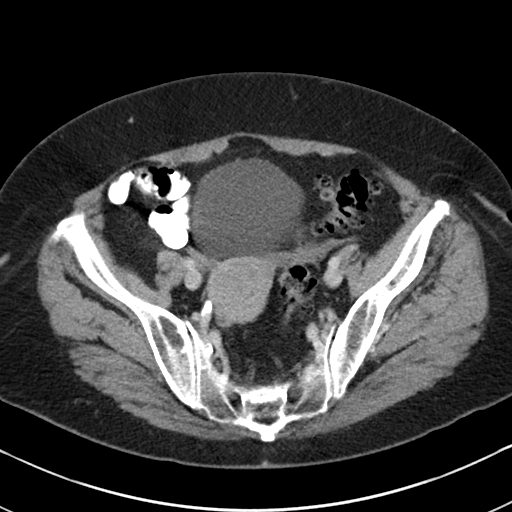
[im 31/88  soft-tissue]
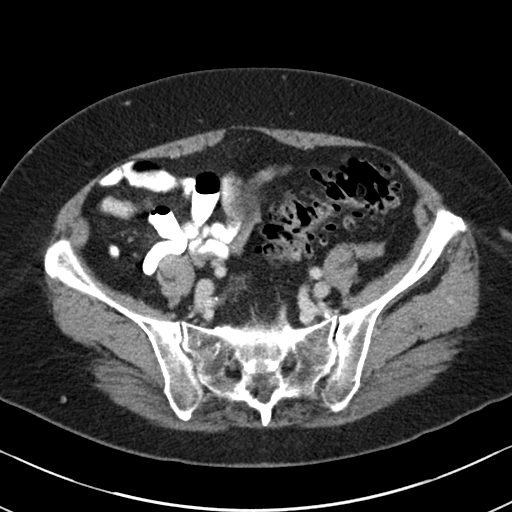
[im 40/88  soft-tissue]
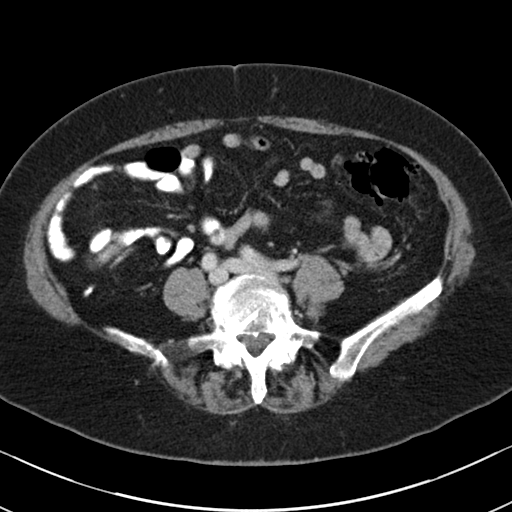
[im 44/88  soft-tissue]
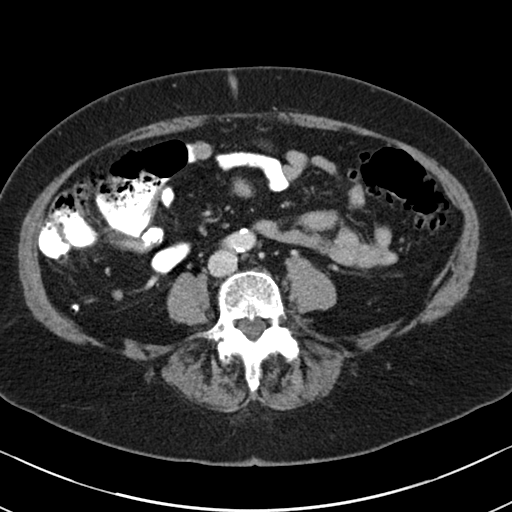
[im 48/88  soft-tissue]
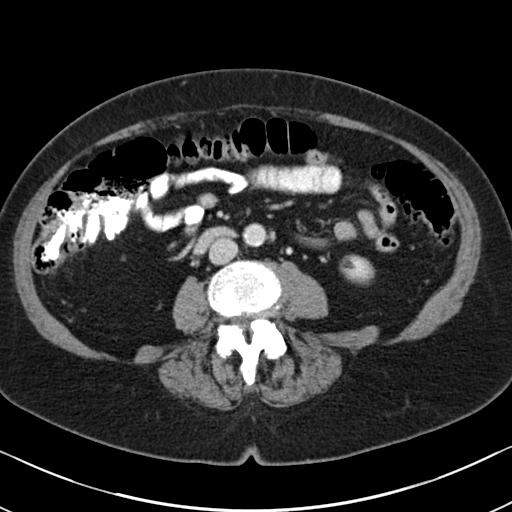
[im 57/88  soft-tissue]
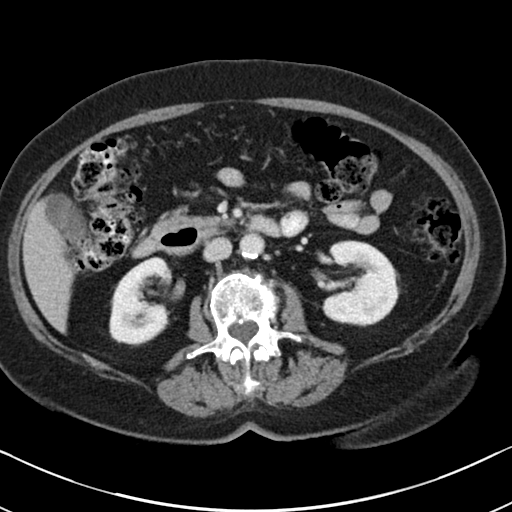
[im 57/88  bone]
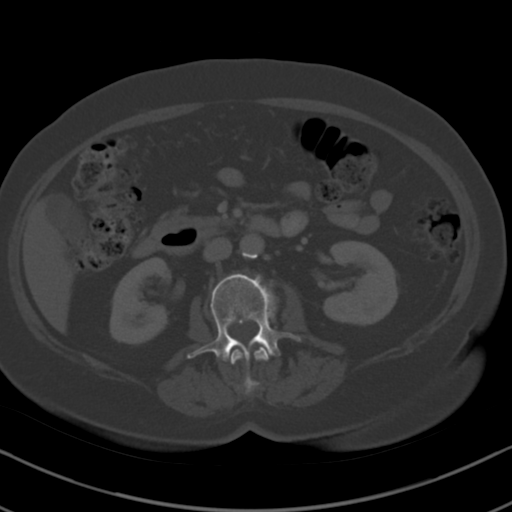
[im 61/88  soft-tissue]
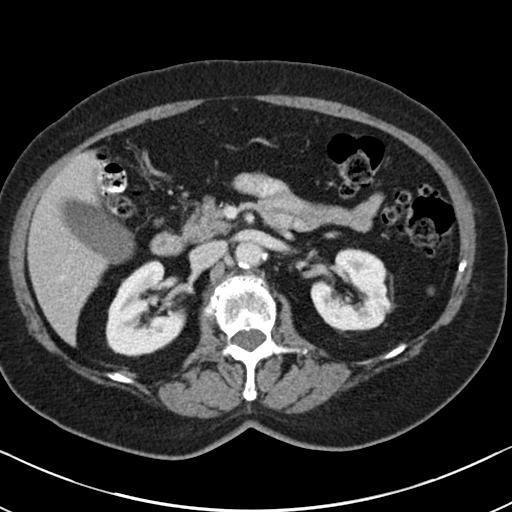
[im 70/88  soft-tissue]
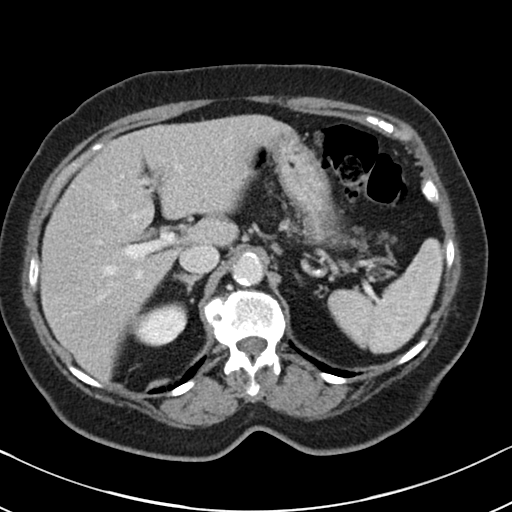
[im 74/88  soft-tissue]
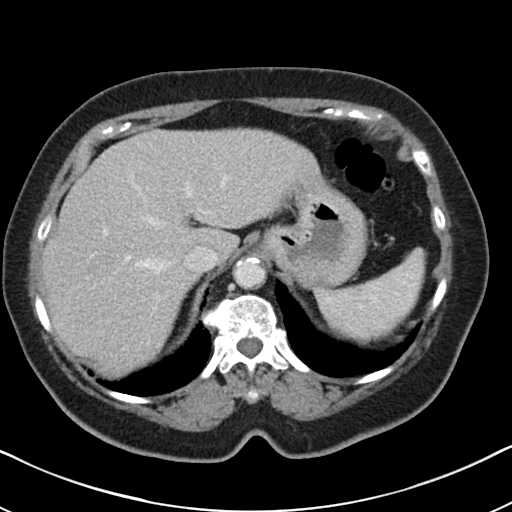
[im 83/88  soft-tissue]
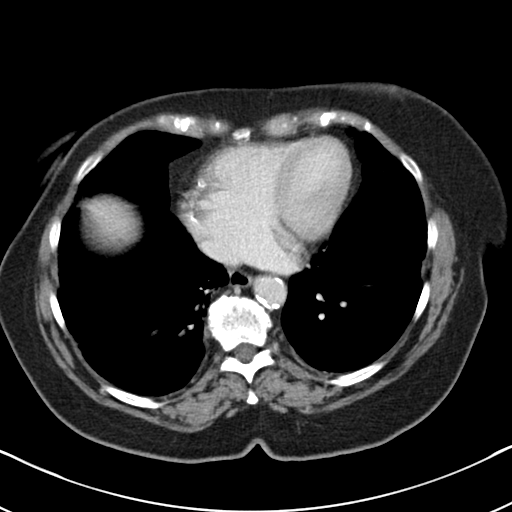

[Series 5: cor · coronal · 0.73mm/px · 3 of 166 slices shown]
[im 56/166  soft-tissue]
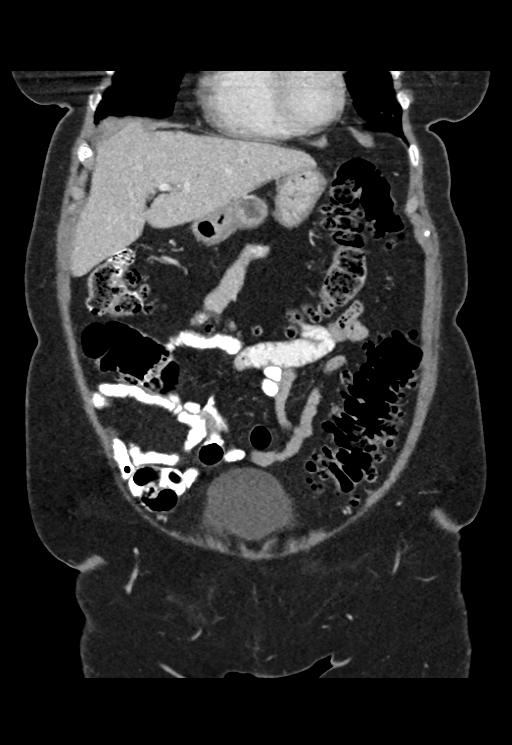
[im 74/166  soft-tissue]
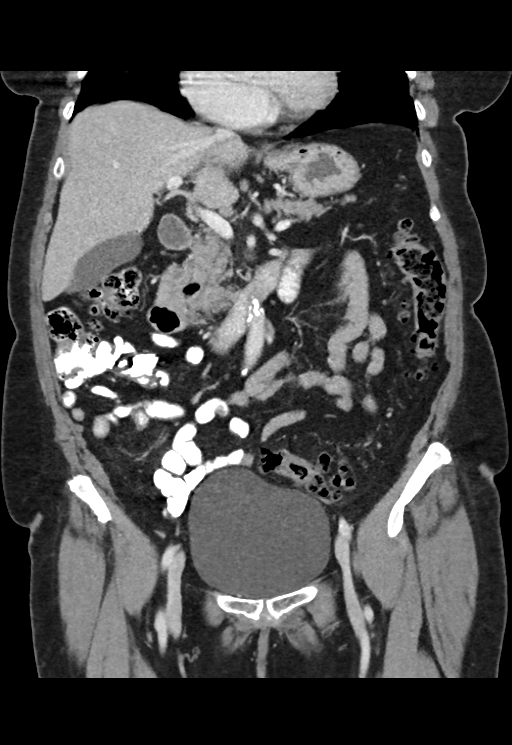
[im 92/166  soft-tissue]
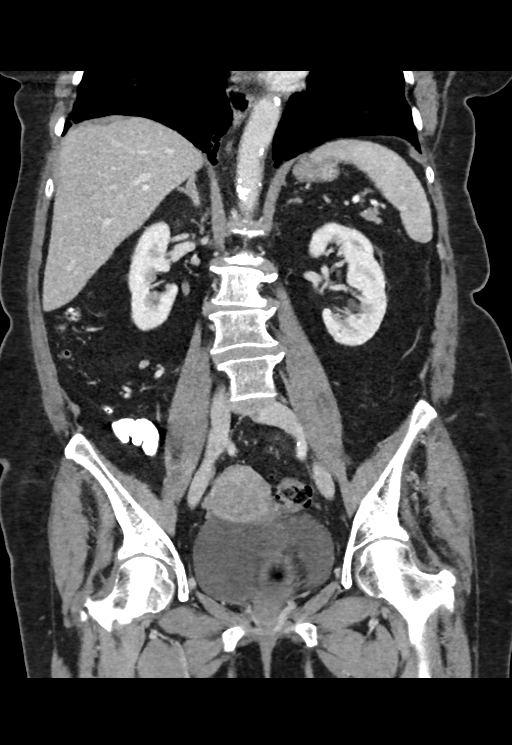

[16 of 46 positions shown; findings below may reference images not displayed]

FINDINGS: Lung bases are clear. No pericardial fluid. Small benign appearing
hypodense lesions in the liver unchanged. The gallbladder, pancreas,
spleen, adrenal glands, and kidneys are unchanged from prior
demonstrate no acute findings. There are bilateral nonenhancing
cysts within the kidneys.

The stomach, small bowel, appendix, and cecum are normal. There are
multiple diverticula scattered throughout the entirety of the colon
but most dense within the descending colon sigmoid colon. There is
no evidence of acute inflammation associated with the multiple
diverticula.

Abdominal aorta is normal in caliber. No retroperitoneal or
periportal lymphadenopathy. No free fluid the pelvis. The uterus
contains a rounded mass is most suggestive of a leiomyoma. The
adnexa are normal. Bladder is normal. Multiple levels of endplate
spurring and facet hypertrophy within the thorax the lumbar spine.
Grade 1 anterolisthesis of L4 on L5 without evidence of pars
fracture.
IMPRESSION: 1.  No acute findings in the abdomen or pelvis.

2. Extensive colonic diverticulosis without evidence of acute
diverticulitis

3. Marked degenerative disc disease and facet arthropathy in the
lumbar spine.

## 2015-03-23 ENCOUNTER — Other Ambulatory Visit: Payer: Medicare Other

## 2015-04-16 ENCOUNTER — Other Ambulatory Visit (INDEPENDENT_AMBULATORY_CARE_PROVIDER_SITE_OTHER): Payer: Medicare Other | Admitting: *Deleted

## 2015-04-16 DIAGNOSIS — I10 Essential (primary) hypertension: Secondary | ICD-10-CM | POA: Diagnosis not present

## 2015-04-16 LAB — BASIC METABOLIC PANEL
BUN: 15 mg/dL (ref 7–25)
CO2: 28 mmol/L (ref 20–31)
Calcium: 9.1 mg/dL (ref 8.6–10.4)
Chloride: 100 mmol/L (ref 98–110)
Creat: 0.81 mg/dL (ref 0.60–0.88)
GLUCOSE: 127 mg/dL — AB (ref 65–99)
POTASSIUM: 4.3 mmol/L (ref 3.5–5.3)
SODIUM: 138 mmol/L (ref 135–146)

## 2015-04-16 LAB — CBC
HCT: 40.1 % (ref 36.0–46.0)
HEMOGLOBIN: 13.4 g/dL (ref 12.0–15.0)
MCH: 29.4 pg (ref 26.0–34.0)
MCHC: 33.4 g/dL (ref 30.0–36.0)
MCV: 87.9 fL (ref 78.0–100.0)
MPV: 9.4 fL (ref 8.6–12.4)
PLATELETS: 263 10*3/uL (ref 150–400)
RBC: 4.56 MIL/uL (ref 3.87–5.11)
RDW: 12.7 % (ref 11.5–15.5)
WBC: 8.5 10*3/uL (ref 4.0–10.5)

## 2015-04-16 NOTE — Progress Notes (Signed)
Quick Note:  Please make copy of labs for patient visit. ______ 

## 2015-04-16 NOTE — Addendum Note (Signed)
Addended by: Eulis Foster on: 04/16/2015 09:28 AM   Modules accepted: Orders

## 2015-04-17 ENCOUNTER — Other Ambulatory Visit: Payer: Self-pay | Admitting: Cardiology

## 2015-04-17 DIAGNOSIS — F419 Anxiety disorder, unspecified: Secondary | ICD-10-CM

## 2015-04-17 NOTE — Telephone Encounter (Signed)
Okay to refill tranxene

## 2015-04-17 NOTE — Telephone Encounter (Signed)
Patient is requesting a refill of Clorazepate 3.75.  She has only one tablet for today.  Please advise.

## 2015-04-20 ENCOUNTER — Ambulatory Visit (INDEPENDENT_AMBULATORY_CARE_PROVIDER_SITE_OTHER): Payer: Medicare Other | Admitting: Cardiology

## 2015-04-20 ENCOUNTER — Encounter: Payer: Self-pay | Admitting: Cardiology

## 2015-04-20 VITALS — BP 148/80 | HR 70 | Ht 64.0 in | Wt 166.8 lb

## 2015-04-20 DIAGNOSIS — I119 Hypertensive heart disease without heart failure: Secondary | ICD-10-CM

## 2015-04-20 MED ORDER — BISOPROLOL-HYDROCHLOROTHIAZIDE 2.5-6.25 MG PO TABS: 1.0000 | ORAL_TABLET | Freq: Two times a day (BID) | ORAL | Status: DC

## 2015-04-20 NOTE — Patient Instructions (Addendum)
Medication Instructions:  Your physician recommends that you continue on your current medications as directed. Please refer to the Current Medication list given to you today.  Labwork: none  Testing/Procedures: none  Follow-Up: Your physician recommends that you schedule a follow-up appointment in: 4 months with fasting labs (lp/bmet/hfp) with Tera Helper NP or Brynda Rim PA  If you need a refill on your cardiac medications before your next appointment, please call your pharmacy.

## 2015-04-20 NOTE — Progress Notes (Signed)
Cardiology Office Note   Date:  04/20/2015   ID:  Lynn Walters, DOB [birth year], MRN [id number]  PCP:   Melinda Crutch, MD  Cardiologist: Darlin Coco MD  No chief complaint on file.     History of Present Illness: Lynn Walters is a 81 y.o. female who presents for a scheduled follow-up office visit.  She has a history of palpitations and history of labile hypertension. She has a history of anxiety and depression. Since last visit her depression has been worse and her PCP has increased her citalopram dose.  In September 2013 she was vacationing down at [place] and had chest pain. She was hospitalized overnight. She had a Lexi scan Myoview stress test which showed no ischemia and her ejection fraction was 87%. She had a two-dimensional echocardiogram which was normal and her ejection fraction was 70-75%. These tests were done on 03/19/12. At discharge she was told that she was anxious and she was placed on citalopram which has helped her. Since last visit she has been doing well. Since last visit she has had no new cardiac symptoms. She has had some low back pain and has been told that she has spinal stenosis. She lives by herself. She still drives her car. She is active in [employer] in her church.  Past Medical History  Diagnosis Date  . CONSTIPATION 01/29/2010  . DIVERTICULITIS, HX OF 09/24/2008  . DIVERTICULOSIS OF COLON 03/23/2010  . HYPERLIPIDEMIA 09/24/2008  . HYPERTENSION 09/24/2008  . Kidney stones   . Migraine   . Borderline diabetes   . Anxiety   . Heart palpitations   . Dyslipidemia     Statin intolerant  . Hypertension   . Palpitations     Essential  . Depression   . CAD (coronary artery disease)     Past Surgical History  Procedure Laterality Date  . Cardiac catheterization  1992    Minimal LAD disease  . Cardiovascular stress test  2008    No ischemia; EF 82%  . Cardiovascular stress test  2011    Negative     Current Outpatient  Prescriptions  Medication Sig Dispense Refill  . bisoprolol-hydrochlorothiazide (ZIAC) 2.5-6.25 MG tablet Take 1 tablet by mouth 2 (two) times daily. 180 tablet 3  . Cholecalciferol (VITAMIN D) 2000 UNITS tablet Take 4,000 Units by mouth daily.    . citalopram (CELEXA) 40 MG tablet Take 40 mg by mouth daily.  4  . clorazepate (TRANXENE) 3.75 MG tablet Take 3.75 mg by mouth 2 (two) times daily. May take additional 1 tablet by mouth mid-day as needed for anxiety    . Multiple Vitamins-Iron (MULTIVITAMIN/IRON PO) Take 1 tablet by mouth daily.     . Saline (NASAL MOIST NA) Place 1 Squirt into both nostrils as needed (nasal dryness).     . Tetrahydrozoline HCl (EYE DROPS OP) Place 1 drop into both eyes as needed (dry eye).      No current facility-administered medications for this visit.    Allergies:   Amlodipine; Penicillins; Shellfish allergy; and Shellfish-derived products    Social History:  The patient  reports that she has never smoked. She has never used smokeless tobacco. She reports that she does not drink alcohol or use illicit drugs.   Family History:  The patient's family history includes Cancer in her mother; Diabetes in her father.    ROS:  Please see the history of present illness.   Otherwise, review of systems are positive  for none.   All other systems are reviewed and negative.    PHYSICAL EXAM: VS:  BP 148/80 mmHg  Pulse 70  Ht 5\' 4"  (1.626 m)  Wt 166 lb 12.8 oz (75.66 kg)  BMI 28.62 kg/m2 , BMI Body mass index is 28.62 kg/(m^2). GEN: Well nourished, well developed, in no acute distress HEENT: normal Neck: no JVD, carotid bruits, or masses Cardiac: RRR; no murmurs, rubs, or gallops,no edema  Respiratory:  clear to auscultation bilaterally, normal work of breathing GI: soft, nontender, nondistended, + BS MS: no deformity or atrophy Skin: warm and dry, no rash Neuro:  Strength and sensation are intact Psych: euthymic mood, full affect   EKG:  EKG is ordered  today. The ekg ordered today demonstrates normal sinus rhythm with bifascicular block.  Since 08/18/14, no significant change.   Recent Labs: 04/16/2015: BUN 15; Creat 0.81; Hemoglobin 13.4; Platelets 263; Potassium 4.3; Sodium 138    Lipid Panel    Component Value Date/Time   CHOL 192 01/09/2014 1013   TRIG 122.0 01/09/2014 1013   HDL 38.30* 01/09/2014 1013   CHOLHDL 5 01/09/2014 1013   VLDL 24.4 01/09/2014 1013   LDLCALC 129* 01/09/2014 1013      Wt Readings from Last 3 Encounters:  04/20/15 166 lb 12.8 oz (75.66 kg)  12/12/14 166 lb 12.8 oz (75.66 kg)  08/18/14 165 lb 6.4 oz (75.025 kg)        ASSESSMENT AND PLAN:  1. essential hypertension 2. bifascicular block, asymptomatic 3. anxiety and depression 4. past history of severe epistaxis.  No recurrence.  Her recent hemoglobin is back to normal. 5. palpitations related to anxiety  Plan: Continue on same medication. Recheck in 4 months for followup office visit fasting lipid panel hepatic function panel and basal metabolic panel   Current medicines are reviewed at length with the patient today.  The patient does not have concerns regarding medicines.  The following changes have been made:  no change  Labs/ tests ordered today include:   Orders Placed This Encounter  Procedures  . EKG 12-Lead       Signed, Darlin Coco MD 04/20/2015 10:16 AM    [facility], [place], [place]  [address] Phone: [phone]; Fax: [phone]

## 2015-08-12 ENCOUNTER — Ambulatory Visit (INDEPENDENT_AMBULATORY_CARE_PROVIDER_SITE_OTHER): Payer: Medicare Other | Admitting: Nurse Practitioner

## 2015-08-12 ENCOUNTER — Encounter: Payer: Self-pay | Admitting: Nurse Practitioner

## 2015-08-12 VITALS — BP 130/62 | HR 67 | Ht 62.0 in | Wt 166.0 lb

## 2015-08-12 DIAGNOSIS — R002 Palpitations: Secondary | ICD-10-CM | POA: Diagnosis not present

## 2015-08-12 DIAGNOSIS — I119 Hypertensive heart disease without heart failure: Secondary | ICD-10-CM | POA: Diagnosis not present

## 2015-08-12 NOTE — Progress Notes (Signed)
CARDIOLOGY OFFICE NOTE  Date:  08/12/2015    [name] Date of Birth: [birth year] Medical Record [id number]  PCP:   Melinda Crutch, MD  Cardiologist:  Mare Ferrari    Chief Complaint  Patient presents with  . Hypertension  . Palpitations    4 month check - seen for Dr. Mare Ferrari    History of Present Illness: Lynn Walters is a 82 y.o. female who presents today for a 4 month check. Seen for Dr. Mare Ferrari.   She has a history of palpitations and history of labile hypertension. She has a history of anxiety and depression.  In September 2013 she was vacationing down at [employer] and had chest pain. She was hospitalized overnight. She had a Lexi scan Myoview stress test which showed no ischemia and her ejection fraction was 87%. She had a two-dimensional echocardiogram which was normal and her ejection fraction was 70-75%. These tests were done on 03/19/12.   Last seen back in October and other than her anxieties, she was felt to be doing ok.  Comes back today. Here alone. She feels like she is doing ok. No real palpitations. No chest pain. Not very active. BP looks good. Not dizzy or lightheaded. No falls. She has no real concerns today. Labs are checked by PCP and she is seeing him next month.   Past Medical History  Diagnosis Date  . CONSTIPATION 01/29/2010  . DIVERTICULITIS, HX OF 09/24/2008  . DIVERTICULOSIS OF COLON 03/23/2010  . HYPERLIPIDEMIA 09/24/2008  . HYPERTENSION 09/24/2008  . Kidney stones   . Migraine   . Borderline diabetes   . Anxiety   . Heart palpitations   . Dyslipidemia     Statin intolerant  . Hypertension   . Palpitations     Essential  . Depression   . CAD (coronary artery disease)     Past Surgical History  Procedure Laterality Date  . Cardiac catheterization  1992    Minimal LAD disease  . Cardiovascular stress test  2008    No ischemia; EF 82%  . Cardiovascular stress test  2011    Negative     Medications: Current Outpatient  Prescriptions  Medication Sig Dispense Refill  . bisoprolol-hydrochlorothiazide (ZIAC) 2.5-6.25 MG tablet Take 1 tablet by mouth 2 (two) times daily. 180 tablet 3  . Cholecalciferol (VITAMIN D) 2000 UNITS tablet Take 4,000 Units by mouth daily.    . citalopram (CELEXA) 40 MG tablet Take 40 mg by mouth daily.  4  . clorazepate (TRANXENE) 3.75 MG tablet TAKE 1 TABLET EVERY MORNING AND 1 TABLET EVERY EVENING, MAY TAKE AN ADDITIONAL TABLET MIDDAY AS NEEDED 90 tablet 3  . fluticasone (FLONASE) 50 MCG/ACT nasal spray Place 1 spray into both nostrils daily.    . Multiple Vitamins-Iron (MULTIVITAMIN/IRON PO) Take 1 tablet by mouth daily.     . Saline (NASAL MOIST NA) Place 1 Squirt into both nostrils as needed (nasal dryness).     . Tetrahydrozoline HCl (EYE DROPS OP) Place 1 drop into both eyes as needed (dry eye).      No current facility-administered medications for this visit.    Allergies: Allergies  Allergen Reactions  . Amlodipine     Unknown. Thinks BP went up  . Penicillins Hives and Rash  . Shellfish Allergy Itching and Rash  . Shellfish-Derived Products Rash    Social History: The patient  reports that she has never smoked. She has never used smokeless tobacco. She reports that  she does not drink alcohol or use illicit drugs.   Family History: The patient's family history includes Cancer in her mother; Diabetes in her father.   Review of Systems: Please see the history of present illness.   Otherwise, the review of systems is positive for none.   All other systems are reviewed and negative.   Physical Exam: VS:  BP 130/62 mmHg  Pulse 67  Ht 5\' 2"  (1.575 m)  Wt 166 lb (75.297 kg)  BMI 30.35 kg/m2  SpO2 98% .  BMI Body mass index is 30.35 kg/(m^2).  Wt Readings from Last 3 Encounters:  08/12/15 166 lb (75.297 kg)  04/20/15 166 lb 12.8 oz (75.66 kg)  12/12/14 166 lb 12.8 oz (75.66 kg)    General: Pleasant. She looks younger than her stated age and is in no acute  distress.  HEENT: Normal. Neck: Supple, no JVD, carotid bruits, or masses noted.  Cardiac: Regular rate and rhythm. No murmurs, rubs, or gallops. No edema.  Respiratory:  Lungs are clear to auscultation bilaterally with normal work of breathing.  GI: Soft and nontender.  MS: No deformity or atrophy. Gait and ROM intact. Skin: Warm and dry. Color is normal.  Neuro:  Strength and sensation are intact and no gross focal deficits noted.  Psych: Alert, appropriate and with normal affect.   LABORATORY DATA:  EKG:  EKG is not ordered today.  Lab Results  Component Value Date   WBC 8.5 04/16/2015   HGB 13.4 04/16/2015   HCT 40.1 04/16/2015   PLT 263 04/16/2015   GLUCOSE 127* 04/16/2015   CHOL 192 01/09/2014   TRIG 122.0 01/09/2014   HDL 38.30* 01/09/2014   LDLCALC 129* 01/09/2014   ALT 11 01/09/2014   AST 17 01/09/2014   NA 138 04/16/2015   K 4.3 04/16/2015   CL 100 04/16/2015   CREATININE 0.81 04/16/2015   BUN 15 04/16/2015   CO2 28 04/16/2015   TSH 2.400 04/14/2010   INR 0.94 04/14/2010   HGBA1C * 04/14/2010    6.1 (NOTE)                                                                       According to the ADA Clinical Practice Recommendations for 2011, when HbA1c is used as a screening test:   >=6.5%   Diagnostic of Diabetes Mellitus           (if abnormal result  is confirmed)  5.7-6.4%   Increased risk of developing Diabetes Mellitus  References:Diagnosis and Classification of Diabetes Mellitus,Diabetes [id number] 1):S62-S69 and Standards of Medical Care in         Diabetes - 2011,Diabetes Care,2011,34  (Suppl 1):S11-S61.    BNP (last 3 results) No results for input(s): BNP in the last 8760 hours.  ProBNP (last 3 results) No results for input(s): PROBNP in the last 8760 hours.   Other Studies Reviewed Today:   Assessment/Plan: 1. HTN - BP ok on her current regimen.   2. Bifascicular block, asymptomatic  3. Anxiety and depression  4.  Palpitations  related to anxiety - not really an issue at this time.   Current medicines are reviewed with the patient today.  The patient does not have concerns regarding  medicines other than what has been noted above.  The following changes have been made:  See above.  Labs/ tests ordered today include:   No orders of the defined types were placed in this encounter.     Disposition:   FU with Dr. Meda Coffee going forward. See in 6 months.    Patient is agreeable to this plan and will call if any problems develop in the interim.   Signed: Burtis Junes, RN, ANP-C 08/12/2015 10:50 AM  [facility] [address] [place], [place]  [address] Phone: [phone] Fax: [phone]

## 2015-08-12 NOTE — Patient Instructions (Addendum)
We will be checking the following labs today - NONE   Medication Instructions:    Continue with your current medicines.     Testing/Procedures To Be Arranged:  N/A  Follow-Up:   See Dr. Meda Coffee in 6 months.     Other Special Instructions:   N/A    If you need a refill on your cardiac medications before your next appointment, please call your pharmacy.   Call the [place] office at [phone] if you have any questions, problems or concerns.

## 2015-08-25 DIAGNOSIS — E784 Other hyperlipidemia: Secondary | ICD-10-CM | POA: Diagnosis not present

## 2015-08-25 DIAGNOSIS — Z0000 Encounter for general adult medical examination without abnormal findings: Secondary | ICD-10-CM | POA: Diagnosis not present

## 2015-08-25 DIAGNOSIS — E559 Vitamin D deficiency, unspecified: Secondary | ICD-10-CM | POA: Diagnosis not present

## 2015-08-25 DIAGNOSIS — F324 Major depressive disorder, single episode, in partial remission: Secondary | ICD-10-CM | POA: Diagnosis not present

## 2015-08-25 DIAGNOSIS — F411 Generalized anxiety disorder: Secondary | ICD-10-CM | POA: Diagnosis not present

## 2015-08-25 DIAGNOSIS — I10 Essential (primary) hypertension: Secondary | ICD-10-CM | POA: Diagnosis not present

## 2015-12-29 DIAGNOSIS — F411 Generalized anxiety disorder: Secondary | ICD-10-CM | POA: Diagnosis not present

## 2015-12-29 DIAGNOSIS — F324 Major depressive disorder, single episode, in partial remission: Secondary | ICD-10-CM | POA: Diagnosis not present

## 2016-02-09 DIAGNOSIS — M25561 Pain in right knee: Secondary | ICD-10-CM | POA: Diagnosis not present

## 2016-02-17 DIAGNOSIS — M25561 Pain in right knee: Secondary | ICD-10-CM | POA: Diagnosis not present

## 2016-02-27 DIAGNOSIS — M25561 Pain in right knee: Secondary | ICD-10-CM | POA: Diagnosis not present

## 2016-03-09 NOTE — Progress Notes (Signed)
Cardiology Office Note    Date:  03/11/2016   ID:  Lynn Walters, DOB 1927/09/11, MRN [id number]  PCP:   Melinda Crutch, MD  Cardiologist:  Mare Ferrari  --> now Dr. Meda Coffee  Chief Complaint: Surgical clearance  History of Present Illness:   Lynn Walters is a 82 y.o. female CAD, palpitations related to anxiety, labile HTN, HLD and depression who presented for   In September 2013 she was vacationing down at [place] and had chest pain. She was hospitalized overnight. She had a Lexi scan Myoview stress test which showed no ischemia and her ejection fraction was 87%. She had a two-dimensional echocardiogram which was normal and her ejection fraction was 70-75%. These tests were done on 03/19/12. At discharge she was told that she was anxious and she was placed on citalopram which has helped her  Last seen in office by Truitt Merle 08/12/15. At that time she was doing well on cardiac stand point.   The patient fall few months ago and injured R knee. Seen by Dr. Gladstone Lighter and plan for R Meniscus repair surgery per patient. The patient had one episode of L sided upper chest pain about one month that woke her from sleep. She was laying on left side. Adjusted her pillow and went back to sleep. She think that this is due to shoulder pain. Had similar episode prior to this but in R shoulder. The patient denies any dyspnea or chest pain with or without exertion. No palpitations, orthopnea, syncope, PND or Le edema. BP of 130/50-60s at home.    Past Medical History:  Diagnosis Date  . Anxiety   . Borderline diabetes   . CAD (coronary artery disease)   . CONSTIPATION 01/29/2010  . Depression   . DIVERTICULITIS, HX OF 09/24/2008  . DIVERTICULOSIS OF COLON 03/23/2010  . Dyslipidemia    Statin intolerant  . Heart palpitations   . HYPERLIPIDEMIA 09/24/2008  . HYPERTENSION 09/24/2008  . Hypertension   . Kidney stones   . Migraine   . Palpitations    Essential    Past Surgical History:  Procedure  Laterality Date  . CARDIAC CATHETERIZATION  1992   Minimal LAD disease  . CARDIOVASCULAR STRESS TEST  2008   No ischemia; EF 82%  . CARDIOVASCULAR STRESS TEST  2011   Negative    Current Medications: Prior to Admission medications   Medication Sig Start Date End Date Taking? Authorizing Provider  bisoprolol-hydrochlorothiazide (ZIAC) 2.5-6.25 MG tablet Take 1 tablet by mouth 2 (two) times daily. 04/20/15   Darlin Coco, MD  Cholecalciferol (VITAMIN D) 2000 UNITS tablet Take 4,000 Units by mouth daily.    Historical Provider, MD  citalopram (CELEXA) 40 MG tablet Take 40 mg by mouth daily. 11/10/14   Historical Provider, MD  clorazepate (TRANXENE) 3.75 MG tablet TAKE 1 TABLET EVERY MORNING AND 1 TABLET EVERY EVENING, MAY TAKE AN ADDITIONAL TABLET MIDDAY AS NEEDED 04/20/15   Darlin Coco, MD  fluticasone (FLONASE) 50 MCG/ACT nasal spray Place 1 spray into both nostrils daily.    Historical Provider, MD  Multiple Vitamins-Iron (MULTIVITAMIN/IRON PO) Take 1 tablet by mouth daily.     Historical Provider, MD  Saline (NASAL MOIST NA) Place 1 Squirt into both nostrils as needed (nasal dryness).     Historical Provider, MD  Tetrahydrozoline HCl (EYE DROPS OP) Place 1 drop into both eyes as needed (dry eye).     Historical Provider, MD    Allergies:   Amlodipine; Penicillins; Shellfish  allergy; and Shellfish-derived products   Social History   Social History  . Marital status: Widowed    Spouse name: N/A  . Number of children: N/A  . Years of education: N/A   Social History Main Topics  . Smoking status: Never Smoker  . Smokeless tobacco: Never Used  . Alcohol use No  . Drug use: No  . Sexual activity: Not Asked   Other Topics Concern  . None   Social History Narrative  . None     Family History:  The patient's family history includes Cancer in her mother; Diabetes in her father.   ROS:   Please see the history of present illness.    ROS All other systems reviewed and  are negative.   PHYSICAL EXAM:   VS:  BP (!) 160/78   Pulse 66   Ht 5\' 3"  (1.6 m)   Wt 164 lb (74.4 kg)   BMI 29.05 kg/m    GEN: Well nourished, well developed, in no acute distress  HEENT: normal  Neck: no JVD, carotid bruits, or masses Cardiac: RRR; no murmurs, rubs, or gallops,no edema  Respiratory:  clear to auscultation bilaterally, normal work of breathing GI: soft, nontender, nondistended, + BS MS: no deformity or atrophy  Skin: warm and dry, no rash Neuro:  Alert and Oriented x 3, Strength and sensation are intact Psych: euthymic mood, full affect  Wt Readings from Last 3 Encounters:  03/11/16 164 lb (74.4 kg)  08/12/15 166 lb (75.3 kg)  04/20/15 166 lb 12.8 oz (75.7 kg)      Studies/Labs Reviewed:   EKG:  EKG is ordered today.  The ekg ordered today demonstrates sinus rhythm at rate of 66 bpm, bifascicular block. No acute changes.   Recent Labs: 04/16/2015: BUN 15; Creat 0.81; Hemoglobin 13.4; Platelets 263; Potassium 4.3; Sodium 138   Lipid Panel    Component Value Date/Time   CHOL 192 01/09/2014 1013   TRIG 122.0 01/09/2014 1013   HDL 38.30 (L) 01/09/2014 1013   CHOLHDL 5 01/09/2014 1013   VLDL 24.4 01/09/2014 1013   LDLCALC 129 (H) 01/09/2014 1013    Additional studies/ records that were reviewed today include:   As above   ASSESSMENT & PLAN:    1. HTN - Elevated today. BP of 160/78 initially, repeat 150/70. He states that she is anxious today due to her surgery. BP of 130/50-60s at home. No change. She will let us know if BP continues to runs above 140/90s.   2. Palpitation related to anxiety - No recent episodes for months.   3. Bifascicular block, asymptomatic, chronic   4. Surgical clearance - No dizziness or syncope. Her elevated BP likely related to anxiety. States "I am nervous". Discussed with Dr. Meda Coffee who is new cardiologist of her. She is cleared for surgery with low cardiac risk.     Medication Adjustments/Labs and Tests  Ordered: Current medicines are reviewed at length with the patient today.  Concerns regarding medicines are outlined above.  Medication changes, Labs and Tests ordered today are listed in the Patient Instructions below. Patient Instructions  Medication Instructions:  Your physician recommends that you continue on your current medications as directed. Please refer to the Current Medication list given to you today.  Labwork: None ordered  Testing/Procedures: None ordered  Follow-Up: Your physician wants you to follow-up in: [address] DR. Johann Capers will receive a reminder letter in the mail two months in advance. If you don't receive a  letter, please call our office to schedule the follow-up appointment.   Any Other Special Instructions Will Be Listed Below (If Applicable).  You are cleared from a cardiac standpoint of view for your upcoming surgery.   If you need a refill on your cardiac medications before your next appointment, please call your pharmacy.      Jarrett Soho, [place]  03/11/2016 2:00 PM    [facility] [address], [place], [place]  [address] Phone: [phone]; Fax: [phone]

## 2016-03-10 ENCOUNTER — Encounter: Payer: Self-pay | Admitting: Physician Assistant

## 2016-03-10 ENCOUNTER — Telehealth: Payer: Self-pay | Admitting: Nurse Practitioner

## 2016-03-10 NOTE — Telephone Encounter (Signed)
Called pt and left message asking pt to call back to give information concerning the doctor that is asking for surgical clearance.

## 2016-03-11 ENCOUNTER — Encounter: Payer: Self-pay | Admitting: Physician Assistant

## 2016-03-11 ENCOUNTER — Ambulatory Visit (INDEPENDENT_AMBULATORY_CARE_PROVIDER_SITE_OTHER): Payer: Medicare Other | Admitting: Physician Assistant

## 2016-03-11 ENCOUNTER — Encounter (INDEPENDENT_AMBULATORY_CARE_PROVIDER_SITE_OTHER): Payer: Self-pay

## 2016-03-11 VITALS — BP 160/78 | HR 66 | Ht 63.0 in | Wt 164.0 lb

## 2016-03-11 DIAGNOSIS — Z01810 Encounter for preprocedural cardiovascular examination: Secondary | ICD-10-CM | POA: Diagnosis not present

## 2016-03-11 DIAGNOSIS — R002 Palpitations: Secondary | ICD-10-CM | POA: Diagnosis not present

## 2016-03-11 DIAGNOSIS — F419 Anxiety disorder, unspecified: Secondary | ICD-10-CM | POA: Diagnosis not present

## 2016-03-11 DIAGNOSIS — I10 Essential (primary) hypertension: Secondary | ICD-10-CM

## 2016-03-11 DIAGNOSIS — I452 Bifascicular block: Secondary | ICD-10-CM

## 2016-03-11 NOTE — Patient Instructions (Addendum)
Medication Instructions:  Your physician recommends that you continue on your current medications as directed. Please refer to the Current Medication list given to you today.  Labwork: None ordered  Testing/Procedures: None ordered  Follow-Up: Your physician wants you to follow-up in: [place] DR. Johann Capers will receive a reminder letter in the mail two months in advance. If you don't receive a letter, please call our office to schedule the follow-up appointment.   Any Other Special Instructions Will Be Listed Below (If Applicable).  You are cleared from a cardiac standpoint of view for your upcoming surgery.   If you need a refill on your cardiac medications before your next appointment, please call your pharmacy.

## 2016-03-16 NOTE — Addendum Note (Signed)
Addended by: Zebedee Iba on: 03/16/2016 10:40 AM   Modules accepted: Orders

## 2016-05-10 DIAGNOSIS — M25569 Pain in unspecified knee: Secondary | ICD-10-CM | POA: Diagnosis not present

## 2016-05-10 DIAGNOSIS — F419 Anxiety disorder, unspecified: Secondary | ICD-10-CM | POA: Diagnosis not present

## 2016-05-10 DIAGNOSIS — Z23 Encounter for immunization: Secondary | ICD-10-CM | POA: Diagnosis not present

## 2016-07-26 ENCOUNTER — Other Ambulatory Visit: Payer: Self-pay | Admitting: *Deleted

## 2016-07-26 MED ORDER — BISOPROLOL-HYDROCHLOROTHIAZIDE 2.5-6.25 MG PO TABS: 1.0000 | ORAL_TABLET | Freq: Two times a day (BID) | ORAL | 3 refills | Status: DC

## 2016-09-13 DIAGNOSIS — Z0000 Encounter for general adult medical examination without abnormal findings: Secondary | ICD-10-CM | POA: Diagnosis not present

## 2016-09-13 DIAGNOSIS — I10 Essential (primary) hypertension: Secondary | ICD-10-CM | POA: Diagnosis not present

## 2016-09-13 DIAGNOSIS — E784 Other hyperlipidemia: Secondary | ICD-10-CM | POA: Diagnosis not present

## 2016-09-13 DIAGNOSIS — M79672 Pain in left foot: Secondary | ICD-10-CM | POA: Diagnosis not present

## 2016-09-13 DIAGNOSIS — M79673 Pain in unspecified foot: Secondary | ICD-10-CM | POA: Diagnosis not present

## 2016-09-13 DIAGNOSIS — K30 Functional dyspepsia: Secondary | ICD-10-CM | POA: Diagnosis not present

## 2016-09-13 DIAGNOSIS — E559 Vitamin D deficiency, unspecified: Secondary | ICD-10-CM | POA: Diagnosis not present

## 2016-09-13 DIAGNOSIS — R6889 Other general symptoms and signs: Secondary | ICD-10-CM | POA: Diagnosis not present

## 2016-09-13 DIAGNOSIS — F324 Major depressive disorder, single episode, in partial remission: Secondary | ICD-10-CM | POA: Diagnosis not present

## 2016-09-13 DIAGNOSIS — F411 Generalized anxiety disorder: Secondary | ICD-10-CM | POA: Diagnosis not present

## 2016-09-14 ENCOUNTER — Other Ambulatory Visit: Payer: Self-pay | Admitting: Family Medicine

## 2016-09-14 DIAGNOSIS — R6889 Other general symptoms and signs: Secondary | ICD-10-CM

## 2016-09-20 ENCOUNTER — Ambulatory Visit
Admission: RE | Admit: 2016-09-20 | Discharge: 2016-09-20 | Disposition: A | Discharge status: Home / Self Care | Payer: Medicare Other | Source: Ambulatory Visit | Attending: Family Medicine | Admitting: Family Medicine

## 2016-09-20 DIAGNOSIS — I6523 Occlusion and stenosis of bilateral carotid arteries: Secondary | ICD-10-CM | POA: Diagnosis not present

## 2016-09-20 DIAGNOSIS — R6889 Other general symptoms and signs: Secondary | ICD-10-CM

## 2016-10-24 DIAGNOSIS — M2042 Other hammer toe(s) (acquired), left foot: Secondary | ICD-10-CM | POA: Diagnosis not present

## 2016-10-24 DIAGNOSIS — M6588 Other synovitis and tenosynovitis, other site: Secondary | ICD-10-CM | POA: Diagnosis not present

## 2016-10-24 DIAGNOSIS — L97511 Non-pressure chronic ulcer of other part of right foot limited to breakdown of skin: Secondary | ICD-10-CM | POA: Diagnosis not present

## 2016-10-24 DIAGNOSIS — M2041 Other hammer toe(s) (acquired), right foot: Secondary | ICD-10-CM | POA: Diagnosis not present

## 2016-11-25 ENCOUNTER — Encounter (HOSPITAL_COMMUNITY): Payer: Self-pay | Admitting: Emergency Medicine

## 2016-11-25 ENCOUNTER — Emergency Department (HOSPITAL_COMMUNITY)
Admission: EM | Admit: 2016-11-25 | Discharge: 2016-11-25 | Disposition: A | Discharge status: Home / Self Care | Payer: Medicare Other | Attending: Emergency Medicine | Admitting: Emergency Medicine

## 2016-11-25 ENCOUNTER — Emergency Department (HOSPITAL_COMMUNITY): Payer: Medicare Other

## 2016-11-25 DIAGNOSIS — Z79899 Other long term (current) drug therapy: Secondary | ICD-10-CM | POA: Insufficient documentation

## 2016-11-25 DIAGNOSIS — S299XXA Unspecified injury of thorax, initial encounter: Secondary | ICD-10-CM | POA: Diagnosis not present

## 2016-11-25 DIAGNOSIS — R0781 Pleurodynia: Secondary | ICD-10-CM | POA: Diagnosis not present

## 2016-11-25 DIAGNOSIS — T148XXA Other injury of unspecified body region, initial encounter: Secondary | ICD-10-CM | POA: Diagnosis not present

## 2016-11-25 DIAGNOSIS — I10 Essential (primary) hypertension: Secondary | ICD-10-CM | POA: Diagnosis not present

## 2016-11-25 DIAGNOSIS — R109 Unspecified abdominal pain: Secondary | ICD-10-CM | POA: Insufficient documentation

## 2016-11-25 DIAGNOSIS — Y92410 Unspecified street and highway as the place of occurrence of the external cause: Secondary | ICD-10-CM | POA: Insufficient documentation

## 2016-11-25 DIAGNOSIS — I2510 Atherosclerotic heart disease of native coronary artery without angina pectoris: Secondary | ICD-10-CM | POA: Diagnosis not present

## 2016-11-25 DIAGNOSIS — R51 Headache: Secondary | ICD-10-CM | POA: Diagnosis not present

## 2016-11-25 DIAGNOSIS — R0789 Other chest pain: Secondary | ICD-10-CM | POA: Diagnosis not present

## 2016-11-25 DIAGNOSIS — S20212A Contusion of left front wall of thorax, initial encounter: Secondary | ICD-10-CM | POA: Insufficient documentation

## 2016-11-25 DIAGNOSIS — S3991XA Unspecified injury of abdomen, initial encounter: Secondary | ICD-10-CM | POA: Diagnosis not present

## 2016-11-25 DIAGNOSIS — Y939 Activity, unspecified: Secondary | ICD-10-CM | POA: Diagnosis not present

## 2016-11-25 DIAGNOSIS — M25561 Pain in right knee: Secondary | ICD-10-CM | POA: Diagnosis not present

## 2016-11-25 DIAGNOSIS — Y999 Unspecified external cause status: Secondary | ICD-10-CM | POA: Insufficient documentation

## 2016-11-25 DIAGNOSIS — S0990XA Unspecified injury of head, initial encounter: Secondary | ICD-10-CM | POA: Diagnosis not present

## 2016-11-25 DIAGNOSIS — S199XXA Unspecified injury of neck, initial encounter: Secondary | ICD-10-CM | POA: Diagnosis not present

## 2016-11-25 LAB — COMPREHENSIVE METABOLIC PANEL
ALBUMIN: 4 g/dL (ref 3.5–5.0)
ALT: 15 U/L (ref 14–54)
ANION GAP: 8 (ref 5–15)
AST: 22 U/L (ref 15–41)
Alkaline Phosphatase: 62 U/L (ref 38–126)
BUN: 19 mg/dL (ref 6–20)
CHLORIDE: 97 mmol/L — AB (ref 101–111)
CO2: 31 mmol/L (ref 22–32)
Calcium: 9.7 mg/dL (ref 8.9–10.3)
Creatinine, Ser: 0.75 mg/dL (ref 0.44–1.00)
GFR calc Af Amer: >60 mL/min (ref >60)
GFR calc non Af Amer: >60 mL/min (ref >60)
GLUCOSE: 115 mg/dL — AB (ref 65–99)
POTASSIUM: 4.3 mmol/L (ref 3.5–5.1)
SODIUM: 136 mmol/L (ref 135–145)
Total Bilirubin: 0.4 mg/dL (ref 0.3–1.2)
Total Protein: 7.8 g/dL (ref 6.5–8.1)

## 2016-11-25 LAB — CBC
HEMATOCRIT: 40 % (ref 36.0–46.0)
HEMOGLOBIN: 13.2 g/dL (ref 12.0–15.0)
MCH: 29.6 pg (ref 26.0–34.0)
MCHC: 33 g/dL (ref 30.0–36.0)
MCV: 89.7 fL (ref 78.0–100.0)
Platelets: 224 10*3/uL (ref 150–400)
RBC: 4.46 MIL/uL (ref 3.87–5.11)
RDW: 13.2 % (ref 11.5–15.5)
WBC: 9.1 10*3/uL (ref 4.0–10.5)

## 2016-11-25 LAB — PROTIME-INR
INR: 0.98
Prothrombin Time: 13 seconds (ref 11.4–15.2)

## 2016-11-25 LAB — I-STAT CHEM 8, ED
BUN: 21 mg/dL — ABNORMAL HIGH (ref 6–20)
Calcium, Ion: 1.17 mmol/L (ref 1.15–1.40)
Chloride: 98 mmol/L — ABNORMAL LOW (ref 101–111)
Creatinine, Ser: 0.9 mg/dL (ref 0.44–1.00)
Glucose, Bld: 112 mg/dL — ABNORMAL HIGH (ref 65–99)
HEMATOCRIT: 40 % (ref 36.0–46.0)
HEMOGLOBIN: 13.6 g/dL (ref 12.0–15.0)
Potassium: 4.3 mmol/L (ref 3.5–5.1)
Sodium: 136 mmol/L (ref 135–145)
TCO2: 31 mmol/L (ref 0–100)

## 2016-11-25 MED ORDER — IOPAMIDOL (ISOVUE-300) INJECTION 61%
100.0000 mL | Freq: Once | INTRAVENOUS | Status: AC | PRN
  Administered 2016-11-25: 100 mL via INTRAVENOUS

## 2016-11-25 MED ORDER — ACETAMINOPHEN 500 MG PO TABS: 1000.0000 mg | ORAL_TABLET | Freq: Three times a day (TID) | ORAL | 0 refills | Status: AC

## 2016-11-25 MED ORDER — FENTANYL CITRATE (PF) 100 MCG/2ML IJ SOLN
50.0000 ug | Freq: Once | INTRAMUSCULAR | Status: AC
  Administered 2016-11-25: 50 ug via INTRAVENOUS
  Filled 2016-11-25: qty 2

## 2016-11-25 MED ORDER — SODIUM CHLORIDE 0.9 % IV BOLUS (SEPSIS)
125.0000 mL | Freq: Once | INTRAVENOUS | Status: AC
  Administered 2016-11-25: 125 mL via INTRAVENOUS

## 2016-11-25 MED ORDER — IOPAMIDOL (ISOVUE-300) INJECTION 61%
INTRAVENOUS | Status: AC
  Filled 2016-11-25: qty 100

## 2016-11-25 MED ORDER — NAPROXEN 375 MG PO TABS: 375.0000 mg | ORAL_TABLET | Freq: Two times a day (BID) | ORAL | 0 refills | Status: DC
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
## 2016-11-25 NOTE — ED Notes (Signed)
Waiting to pt is clear with neck brace to get a urine sample

## 2016-11-29 DIAGNOSIS — M545 Low back pain: Secondary | ICD-10-CM | POA: Diagnosis not present

## 2016-11-29 DIAGNOSIS — M546 Pain in thoracic spine: Secondary | ICD-10-CM | POA: Diagnosis not present

## 2016-11-29 DIAGNOSIS — M25512 Pain in left shoulder: Secondary | ICD-10-CM | POA: Diagnosis not present

## 2016-12-13 DIAGNOSIS — M546 Pain in thoracic spine: Secondary | ICD-10-CM | POA: Diagnosis not present

## 2016-12-13 DIAGNOSIS — M545 Low back pain: Secondary | ICD-10-CM | POA: Diagnosis not present

## 2017-02-10 DIAGNOSIS — S20212A Contusion of left front wall of thorax, initial encounter: Secondary | ICD-10-CM | POA: Diagnosis not present

## 2017-02-10 DIAGNOSIS — Z23 Encounter for immunization: Secondary | ICD-10-CM | POA: Diagnosis not present

## 2017-02-10 DIAGNOSIS — I700 Atherosclerosis of aorta: Secondary | ICD-10-CM | POA: Diagnosis not present

## 2017-02-10 DIAGNOSIS — M549 Dorsalgia, unspecified: Secondary | ICD-10-CM | POA: Diagnosis not present

## 2017-02-15 DIAGNOSIS — M549 Dorsalgia, unspecified: Secondary | ICD-10-CM | POA: Diagnosis not present

## 2017-02-15 DIAGNOSIS — K5790 Diverticulosis of intestine, part unspecified, without perforation or abscess without bleeding: Secondary | ICD-10-CM | POA: Diagnosis not present

## 2017-02-15 DIAGNOSIS — F419 Anxiety disorder, unspecified: Secondary | ICD-10-CM | POA: Diagnosis not present

## 2017-02-15 DIAGNOSIS — F329 Major depressive disorder, single episode, unspecified: Secondary | ICD-10-CM | POA: Diagnosis not present

## 2017-02-15 DIAGNOSIS — E785 Hyperlipidemia, unspecified: Secondary | ICD-10-CM | POA: Diagnosis not present

## 2017-02-15 DIAGNOSIS — I700 Atherosclerosis of aorta: Secondary | ICD-10-CM | POA: Diagnosis not present

## 2017-02-15 DIAGNOSIS — S20212D Contusion of left front wall of thorax, subsequent encounter: Secondary | ICD-10-CM | POA: Diagnosis not present

## 2017-02-15 DIAGNOSIS — I10 Essential (primary) hypertension: Secondary | ICD-10-CM | POA: Diagnosis not present

## 2017-03-08 DIAGNOSIS — M549 Dorsalgia, unspecified: Secondary | ICD-10-CM | POA: Diagnosis not present

## 2017-03-10 DIAGNOSIS — M549 Dorsalgia, unspecified: Secondary | ICD-10-CM | POA: Diagnosis not present

## 2017-03-14 DIAGNOSIS — F411 Generalized anxiety disorder: Secondary | ICD-10-CM | POA: Diagnosis not present

## 2017-03-14 DIAGNOSIS — R29898 Other symptoms and signs involving the musculoskeletal system: Secondary | ICD-10-CM | POA: Diagnosis not present

## 2017-03-14 DIAGNOSIS — H6121 Impacted cerumen, right ear: Secondary | ICD-10-CM | POA: Diagnosis not present

## 2017-03-14 DIAGNOSIS — M549 Dorsalgia, unspecified: Secondary | ICD-10-CM | POA: Diagnosis not present

## 2017-03-16 DIAGNOSIS — M549 Dorsalgia, unspecified: Secondary | ICD-10-CM | POA: Diagnosis not present

## 2017-03-20 DIAGNOSIS — M7742 Metatarsalgia, left foot: Secondary | ICD-10-CM | POA: Diagnosis not present

## 2017-03-20 DIAGNOSIS — M6588 Other synovitis and tenosynovitis, other site: Secondary | ICD-10-CM | POA: Diagnosis not present

## 2017-03-20 DIAGNOSIS — M2041 Other hammer toe(s) (acquired), right foot: Secondary | ICD-10-CM | POA: Diagnosis not present

## 2017-03-20 DIAGNOSIS — M7741 Metatarsalgia, right foot: Secondary | ICD-10-CM | POA: Diagnosis not present

## 2017-03-21 DIAGNOSIS — M549 Dorsalgia, unspecified: Secondary | ICD-10-CM | POA: Diagnosis not present

## 2017-03-23 DIAGNOSIS — M549 Dorsalgia, unspecified: Secondary | ICD-10-CM | POA: Diagnosis not present

## 2017-03-28 DIAGNOSIS — M549 Dorsalgia, unspecified: Secondary | ICD-10-CM | POA: Diagnosis not present

## 2017-04-10 DIAGNOSIS — Z09 Encounter for follow-up examination after completed treatment for conditions other than malignant neoplasm: Secondary | ICD-10-CM | POA: Diagnosis not present

## 2017-04-10 DIAGNOSIS — M542 Cervicalgia: Secondary | ICD-10-CM | POA: Diagnosis not present

## 2017-04-10 DIAGNOSIS — M549 Dorsalgia, unspecified: Secondary | ICD-10-CM | POA: Diagnosis not present

## 2017-08-15 DIAGNOSIS — R509 Fever, unspecified: Secondary | ICD-10-CM | POA: Diagnosis not present

## 2017-08-15 DIAGNOSIS — R0989 Other specified symptoms and signs involving the circulatory and respiratory systems: Secondary | ICD-10-CM | POA: Diagnosis not present

## 2017-08-18 DIAGNOSIS — R062 Wheezing: Secondary | ICD-10-CM | POA: Diagnosis not present

## 2017-08-18 DIAGNOSIS — R0989 Other specified symptoms and signs involving the circulatory and respiratory systems: Secondary | ICD-10-CM | POA: Diagnosis not present

## 2017-08-18 DIAGNOSIS — R509 Fever, unspecified: Secondary | ICD-10-CM | POA: Diagnosis not present

## 2017-08-22 DIAGNOSIS — R05 Cough: Secondary | ICD-10-CM | POA: Diagnosis not present

## 2017-09-05 DIAGNOSIS — F411 Generalized anxiety disorder: Secondary | ICD-10-CM | POA: Diagnosis not present

## 2017-09-05 DIAGNOSIS — R0981 Nasal congestion: Secondary | ICD-10-CM | POA: Diagnosis not present

## 2017-09-05 DIAGNOSIS — R05 Cough: Secondary | ICD-10-CM | POA: Diagnosis not present

## 2017-10-10 DIAGNOSIS — I10 Essential (primary) hypertension: Secondary | ICD-10-CM | POA: Diagnosis not present

## 2017-10-10 DIAGNOSIS — E7800 Pure hypercholesterolemia, unspecified: Secondary | ICD-10-CM | POA: Diagnosis not present

## 2017-10-10 DIAGNOSIS — E559 Vitamin D deficiency, unspecified: Secondary | ICD-10-CM | POA: Diagnosis not present

## 2017-10-10 DIAGNOSIS — F411 Generalized anxiety disorder: Secondary | ICD-10-CM | POA: Diagnosis not present

## 2017-10-10 DIAGNOSIS — Z0000 Encounter for general adult medical examination without abnormal findings: Secondary | ICD-10-CM | POA: Diagnosis not present

## 2017-10-10 DIAGNOSIS — F324 Major depressive disorder, single episode, in partial remission: Secondary | ICD-10-CM | POA: Diagnosis not present

## 2017-10-10 DIAGNOSIS — I700 Atherosclerosis of aorta: Secondary | ICD-10-CM | POA: Diagnosis not present

## 2018-01-26 DIAGNOSIS — H25013 Cortical age-related cataract, bilateral: Secondary | ICD-10-CM | POA: Diagnosis not present

## 2018-04-10 DIAGNOSIS — I10 Essential (primary) hypertension: Secondary | ICD-10-CM | POA: Diagnosis not present

## 2018-04-10 DIAGNOSIS — Z23 Encounter for immunization: Secondary | ICD-10-CM | POA: Diagnosis not present

## 2018-04-10 DIAGNOSIS — F411 Generalized anxiety disorder: Secondary | ICD-10-CM | POA: Diagnosis not present

## 2018-04-10 DIAGNOSIS — M5430 Sciatica, unspecified side: Secondary | ICD-10-CM | POA: Diagnosis not present

## 2018-05-24 DIAGNOSIS — M545 Low back pain: Secondary | ICD-10-CM | POA: Diagnosis not present

## 2018-05-24 DIAGNOSIS — M25511 Pain in right shoulder: Secondary | ICD-10-CM | POA: Diagnosis not present

## 2018-12-11 DIAGNOSIS — K047 Periapical abscess without sinus: Secondary | ICD-10-CM | POA: Diagnosis not present

## 2018-12-11 DIAGNOSIS — I700 Atherosclerosis of aorta: Secondary | ICD-10-CM | POA: Diagnosis not present

## 2018-12-11 DIAGNOSIS — E7800 Pure hypercholesterolemia, unspecified: Secondary | ICD-10-CM | POA: Diagnosis not present

## 2018-12-11 DIAGNOSIS — F411 Generalized anxiety disorder: Secondary | ICD-10-CM | POA: Diagnosis not present

## 2018-12-11 DIAGNOSIS — F324 Major depressive disorder, single episode, in partial remission: Secondary | ICD-10-CM | POA: Diagnosis not present

## 2018-12-11 DIAGNOSIS — I10 Essential (primary) hypertension: Secondary | ICD-10-CM | POA: Diagnosis not present

## 2018-12-11 DIAGNOSIS — E559 Vitamin D deficiency, unspecified: Secondary | ICD-10-CM | POA: Diagnosis not present

## 2019-04-19 DIAGNOSIS — Z23 Encounter for immunization: Secondary | ICD-10-CM | POA: Diagnosis not present

## 2019-05-07 DIAGNOSIS — H18413 Arcus senilis, bilateral: Secondary | ICD-10-CM | POA: Diagnosis not present

## 2019-06-19 ENCOUNTER — Ambulatory Visit: Payer: Medicare Other | Admitting: Cardiology

## 2019-10-15 DIAGNOSIS — M79671 Pain in right foot: Secondary | ICD-10-CM | POA: Diagnosis not present

## 2019-10-15 DIAGNOSIS — L84 Corns and callosities: Secondary | ICD-10-CM | POA: Diagnosis not present

## 2019-10-18 DIAGNOSIS — E559 Vitamin D deficiency, unspecified: Secondary | ICD-10-CM | POA: Diagnosis not present

## 2019-10-18 DIAGNOSIS — E7800 Pure hypercholesterolemia, unspecified: Secondary | ICD-10-CM | POA: Diagnosis not present

## 2019-10-18 DIAGNOSIS — I10 Essential (primary) hypertension: Secondary | ICD-10-CM | POA: Diagnosis not present

## 2019-10-22 DIAGNOSIS — I10 Essential (primary) hypertension: Secondary | ICD-10-CM | POA: Diagnosis not present

## 2019-10-22 DIAGNOSIS — E7800 Pure hypercholesterolemia, unspecified: Secondary | ICD-10-CM | POA: Diagnosis not present

## 2019-10-22 DIAGNOSIS — F324 Major depressive disorder, single episode, in partial remission: Secondary | ICD-10-CM | POA: Diagnosis not present

## 2019-10-22 DIAGNOSIS — E559 Vitamin D deficiency, unspecified: Secondary | ICD-10-CM | POA: Diagnosis not present

## 2019-10-22 DIAGNOSIS — M5430 Sciatica, unspecified side: Secondary | ICD-10-CM | POA: Diagnosis not present

## 2019-10-22 DIAGNOSIS — F411 Generalized anxiety disorder: Secondary | ICD-10-CM | POA: Diagnosis not present

## 2019-10-22 DIAGNOSIS — I700 Atherosclerosis of aorta: Secondary | ICD-10-CM | POA: Diagnosis not present

## 2019-10-22 DIAGNOSIS — Z0000 Encounter for general adult medical examination without abnormal findings: Secondary | ICD-10-CM | POA: Diagnosis not present

## 2020-02-25 DIAGNOSIS — I4510 Unspecified right bundle-branch block: Secondary | ICD-10-CM | POA: Diagnosis not present

## 2020-02-25 DIAGNOSIS — R42 Dizziness and giddiness: Secondary | ICD-10-CM | POA: Diagnosis not present

## 2020-02-25 DIAGNOSIS — W19XXXA Unspecified fall, initial encounter: Secondary | ICD-10-CM | POA: Diagnosis not present

## 2020-03-13 DIAGNOSIS — H04221 Epiphora due to insufficient drainage, right lacrimal gland: Secondary | ICD-10-CM | POA: Diagnosis not present

## 2020-04-10 DIAGNOSIS — F411 Generalized anxiety disorder: Secondary | ICD-10-CM | POA: Diagnosis not present

## 2020-04-10 DIAGNOSIS — I10 Essential (primary) hypertension: Secondary | ICD-10-CM | POA: Diagnosis not present

## 2020-04-10 DIAGNOSIS — Z23 Encounter for immunization: Secondary | ICD-10-CM | POA: Diagnosis not present

## 2020-04-10 DIAGNOSIS — Z09 Encounter for follow-up examination after completed treatment for conditions other than malignant neoplasm: Secondary | ICD-10-CM | POA: Diagnosis not present

## 2020-04-10 DIAGNOSIS — F324 Major depressive disorder, single episode, in partial remission: Secondary | ICD-10-CM | POA: Diagnosis not present

## 2020-04-10 DIAGNOSIS — S060X9A Concussion with loss of consciousness of unspecified duration, initial encounter: Secondary | ICD-10-CM | POA: Diagnosis not present

## 2020-06-23 DIAGNOSIS — Z03818 Encounter for observation for suspected exposure to other biological agents ruled out: Secondary | ICD-10-CM | POA: Diagnosis not present

## 2020-06-23 DIAGNOSIS — Z7189 Other specified counseling: Secondary | ICD-10-CM | POA: Diagnosis not present

## 2020-06-23 DIAGNOSIS — R059 Cough, unspecified: Secondary | ICD-10-CM | POA: Diagnosis not present

## 2020-11-02 DIAGNOSIS — F411 Generalized anxiety disorder: Secondary | ICD-10-CM | POA: Diagnosis not present

## 2020-11-02 DIAGNOSIS — L680 Hirsutism: Secondary | ICD-10-CM | POA: Diagnosis not present

## 2020-11-02 DIAGNOSIS — E7800 Pure hypercholesterolemia, unspecified: Secondary | ICD-10-CM | POA: Diagnosis not present

## 2020-11-02 DIAGNOSIS — R7309 Other abnormal glucose: Secondary | ICD-10-CM | POA: Diagnosis not present

## 2020-11-02 DIAGNOSIS — Z0000 Encounter for general adult medical examination without abnormal findings: Secondary | ICD-10-CM | POA: Diagnosis not present

## 2020-11-02 DIAGNOSIS — I10 Essential (primary) hypertension: Secondary | ICD-10-CM | POA: Diagnosis not present

## 2020-11-02 DIAGNOSIS — E559 Vitamin D deficiency, unspecified: Secondary | ICD-10-CM | POA: Diagnosis not present

## 2020-11-02 DIAGNOSIS — I700 Atherosclerosis of aorta: Secondary | ICD-10-CM | POA: Diagnosis not present

## 2020-11-02 DIAGNOSIS — F324 Major depressive disorder, single episode, in partial remission: Secondary | ICD-10-CM | POA: Diagnosis not present

## 2021-05-05 DIAGNOSIS — I10 Essential (primary) hypertension: Secondary | ICD-10-CM | POA: Diagnosis not present

## 2021-05-05 DIAGNOSIS — R7309 Other abnormal glucose: Secondary | ICD-10-CM | POA: Diagnosis not present

## 2021-05-05 DIAGNOSIS — F411 Generalized anxiety disorder: Secondary | ICD-10-CM | POA: Diagnosis not present

## 2021-05-05 DIAGNOSIS — Z23 Encounter for immunization: Secondary | ICD-10-CM | POA: Diagnosis not present

## 2021-11-16 DIAGNOSIS — F411 Generalized anxiety disorder: Secondary | ICD-10-CM | POA: Diagnosis not present

## 2021-11-16 DIAGNOSIS — M5430 Sciatica, unspecified side: Secondary | ICD-10-CM | POA: Diagnosis not present

## 2021-11-16 DIAGNOSIS — E7800 Pure hypercholesterolemia, unspecified: Secondary | ICD-10-CM | POA: Diagnosis not present

## 2021-11-16 DIAGNOSIS — Z0000 Encounter for general adult medical examination without abnormal findings: Secondary | ICD-10-CM | POA: Diagnosis not present

## 2021-11-16 DIAGNOSIS — Z23 Encounter for immunization: Secondary | ICD-10-CM | POA: Diagnosis not present

## 2021-11-16 DIAGNOSIS — F324 Major depressive disorder, single episode, in partial remission: Secondary | ICD-10-CM | POA: Diagnosis not present

## 2021-11-16 DIAGNOSIS — R7303 Prediabetes: Secondary | ICD-10-CM | POA: Diagnosis not present

## 2021-11-16 DIAGNOSIS — E559 Vitamin D deficiency, unspecified: Secondary | ICD-10-CM | POA: Diagnosis not present

## 2021-11-16 DIAGNOSIS — I700 Atherosclerosis of aorta: Secondary | ICD-10-CM | POA: Diagnosis not present

## 2021-11-16 DIAGNOSIS — I10 Essential (primary) hypertension: Secondary | ICD-10-CM | POA: Diagnosis not present

## 2021-11-16 DIAGNOSIS — R7309 Other abnormal glucose: Secondary | ICD-10-CM | POA: Diagnosis not present

## 2022-05-30 DIAGNOSIS — F411 Generalized anxiety disorder: Secondary | ICD-10-CM | POA: Diagnosis not present

## 2022-05-30 DIAGNOSIS — I10 Essential (primary) hypertension: Secondary | ICD-10-CM | POA: Diagnosis not present

## 2022-05-30 DIAGNOSIS — Z6827 Body mass index (BMI) 27.0-27.9, adult: Secondary | ICD-10-CM | POA: Diagnosis not present

## 2022-05-30 DIAGNOSIS — Z23 Encounter for immunization: Secondary | ICD-10-CM | POA: Diagnosis not present

## 2022-11-18 DIAGNOSIS — E7800 Pure hypercholesterolemia, unspecified: Secondary | ICD-10-CM | POA: Diagnosis not present

## 2022-11-18 DIAGNOSIS — E559 Vitamin D deficiency, unspecified: Secondary | ICD-10-CM | POA: Diagnosis not present

## 2022-11-18 DIAGNOSIS — E663 Overweight: Secondary | ICD-10-CM | POA: Diagnosis not present

## 2022-11-18 DIAGNOSIS — Z0000 Encounter for general adult medical examination without abnormal findings: Secondary | ICD-10-CM | POA: Diagnosis not present

## 2022-11-18 DIAGNOSIS — R7303 Prediabetes: Secondary | ICD-10-CM | POA: Diagnosis not present

## 2022-11-18 DIAGNOSIS — I10 Essential (primary) hypertension: Secondary | ICD-10-CM | POA: Diagnosis not present

## 2022-11-24 DIAGNOSIS — I700 Atherosclerosis of aorta: Secondary | ICD-10-CM | POA: Diagnosis not present

## 2022-11-24 DIAGNOSIS — F411 Generalized anxiety disorder: Secondary | ICD-10-CM | POA: Diagnosis not present

## 2022-11-24 DIAGNOSIS — I10 Essential (primary) hypertension: Secondary | ICD-10-CM | POA: Diagnosis not present

## 2022-11-24 DIAGNOSIS — R7303 Prediabetes: Secondary | ICD-10-CM | POA: Diagnosis not present

## 2022-11-24 DIAGNOSIS — E559 Vitamin D deficiency, unspecified: Secondary | ICD-10-CM | POA: Diagnosis not present

## 2022-11-24 DIAGNOSIS — E7800 Pure hypercholesterolemia, unspecified: Secondary | ICD-10-CM | POA: Diagnosis not present

## 2022-11-24 DIAGNOSIS — F324 Major depressive disorder, single episode, in partial remission: Secondary | ICD-10-CM | POA: Diagnosis not present

## 2023-02-22 DIAGNOSIS — R296 Repeated falls: Secondary | ICD-10-CM | POA: Diagnosis not present

## 2023-02-22 DIAGNOSIS — F411 Generalized anxiety disorder: Secondary | ICD-10-CM | POA: Diagnosis not present

## 2023-02-22 DIAGNOSIS — Z6828 Body mass index (BMI) 28.0-28.9, adult: Secondary | ICD-10-CM | POA: Diagnosis not present

## 2023-02-22 DIAGNOSIS — I10 Essential (primary) hypertension: Secondary | ICD-10-CM | POA: Diagnosis not present

## 2023-02-22 DIAGNOSIS — R531 Weakness: Secondary | ICD-10-CM | POA: Diagnosis not present

## 2023-05-23 DIAGNOSIS — Z6828 Body mass index (BMI) 28.0-28.9, adult: Secondary | ICD-10-CM | POA: Diagnosis not present

## 2023-05-23 DIAGNOSIS — Z23 Encounter for immunization: Secondary | ICD-10-CM | POA: Diagnosis not present

## 2023-05-23 DIAGNOSIS — I10 Essential (primary) hypertension: Secondary | ICD-10-CM | POA: Diagnosis not present

## 2023-05-23 DIAGNOSIS — M5430 Sciatica, unspecified side: Secondary | ICD-10-CM | POA: Diagnosis not present

## 2023-05-23 DIAGNOSIS — F324 Major depressive disorder, single episode, in partial remission: Secondary | ICD-10-CM | POA: Diagnosis not present

## 2023-05-23 DIAGNOSIS — F411 Generalized anxiety disorder: Secondary | ICD-10-CM | POA: Diagnosis not present

## 2023-12-02 ENCOUNTER — Inpatient Hospital Stay (HOSPITAL_BASED_OUTPATIENT_CLINIC_OR_DEPARTMENT_OTHER)
Admission: EM | Admit: 2023-12-02 | Discharge: 2023-12-04 | DRG: 175 | Disposition: A | Discharge status: Home / Self Care | Attending: Student | Admitting: Student

## 2023-12-02 ENCOUNTER — Emergency Department (HOSPITAL_BASED_OUTPATIENT_CLINIC_OR_DEPARTMENT_OTHER): Admitting: Radiology

## 2023-12-02 ENCOUNTER — Emergency Department (HOSPITAL_BASED_OUTPATIENT_CLINIC_OR_DEPARTMENT_OTHER)

## 2023-12-02 DIAGNOSIS — Z880 Allergy status to penicillin: Secondary | ICD-10-CM

## 2023-12-02 DIAGNOSIS — Z1152 Encounter for screening for COVID-19: Secondary | ICD-10-CM | POA: Diagnosis not present

## 2023-12-02 DIAGNOSIS — R918 Other nonspecific abnormal finding of lung field: Secondary | ICD-10-CM | POA: Diagnosis not present

## 2023-12-02 DIAGNOSIS — R008 Other abnormalities of heart beat: Secondary | ICD-10-CM | POA: Diagnosis not present

## 2023-12-02 DIAGNOSIS — I2694 Multiple subsegmental pulmonary emboli without acute cor pulmonale: Principal | ICD-10-CM | POA: Diagnosis present

## 2023-12-02 DIAGNOSIS — R7303 Prediabetes: Secondary | ICD-10-CM | POA: Diagnosis present

## 2023-12-02 DIAGNOSIS — Z79899 Other long term (current) drug therapy: Secondary | ICD-10-CM | POA: Diagnosis not present

## 2023-12-02 DIAGNOSIS — J9601 Acute respiratory failure with hypoxia: Secondary | ICD-10-CM | POA: Diagnosis present

## 2023-12-02 DIAGNOSIS — R0781 Pleurodynia: Secondary | ICD-10-CM | POA: Diagnosis present

## 2023-12-02 DIAGNOSIS — I2510 Atherosclerotic heart disease of native coronary artery without angina pectoris: Secondary | ICD-10-CM | POA: Diagnosis present

## 2023-12-02 DIAGNOSIS — G43909 Migraine, unspecified, not intractable, without status migrainosus: Secondary | ICD-10-CM | POA: Diagnosis present

## 2023-12-02 DIAGNOSIS — I10 Essential (primary) hypertension: Secondary | ICD-10-CM | POA: Diagnosis present

## 2023-12-02 DIAGNOSIS — Z91013 Allergy to seafood: Secondary | ICD-10-CM | POA: Diagnosis not present

## 2023-12-02 DIAGNOSIS — I4510 Unspecified right bundle-branch block: Secondary | ICD-10-CM | POA: Diagnosis present

## 2023-12-02 DIAGNOSIS — Z87442 Personal history of urinary calculi: Secondary | ICD-10-CM | POA: Diagnosis not present

## 2023-12-02 DIAGNOSIS — R0602 Shortness of breath: Secondary | ICD-10-CM | POA: Diagnosis not present

## 2023-12-02 DIAGNOSIS — E785 Hyperlipidemia, unspecified: Secondary | ICD-10-CM | POA: Diagnosis present

## 2023-12-02 DIAGNOSIS — F419 Anxiety disorder, unspecified: Secondary | ICD-10-CM | POA: Diagnosis present

## 2023-12-02 DIAGNOSIS — F32A Depression, unspecified: Secondary | ICD-10-CM | POA: Diagnosis present

## 2023-12-02 DIAGNOSIS — I2699 Other pulmonary embolism without acute cor pulmonale: Principal | ICD-10-CM | POA: Diagnosis present

## 2023-12-02 DIAGNOSIS — Z833 Family history of diabetes mellitus: Secondary | ICD-10-CM

## 2023-12-02 DIAGNOSIS — Z888 Allergy status to other drugs, medicaments and biological substances status: Secondary | ICD-10-CM

## 2023-12-02 LAB — COMPREHENSIVE METABOLIC PANEL WITH GFR
ALT: 7 U/L (ref 0–44)
AST: 18 U/L (ref 15–41)
Albumin: 4.1 g/dL (ref 3.5–5.0)
Alkaline Phosphatase: 81 U/L (ref 38–126)
Anion gap: 14 (ref 5–15)
BUN: 17 mg/dL (ref 8–23)
CO2: 26 mmol/L (ref 22–32)
Calcium: 10.6 mg/dL — ABNORMAL HIGH (ref 8.9–10.3)
Chloride: 97 mmol/L — ABNORMAL LOW (ref 98–111)
Creatinine, Ser: 0.74 mg/dL (ref 0.44–1.00)
GFR, Estimated: >60 mL/min (ref >60)
Glucose, Bld: 115 mg/dL — ABNORMAL HIGH (ref 70–99)
Potassium: 4.1 mmol/L (ref 3.5–5.1)
Sodium: 136 mmol/L (ref 135–145)
Total Bilirubin: 0.4 mg/dL (ref 0.0–1.2)
Total Protein: 8.5 g/dL — ABNORMAL HIGH (ref 6.5–8.1)

## 2023-12-02 LAB — CBC WITH DIFFERENTIAL/PLATELET
Abs Immature Granulocytes: 0.03 10*3/uL (ref 0.00–0.07)
Basophils Absolute: 0 10*3/uL (ref 0.0–0.1)
Basophils Relative: 0 %
Eosinophils Absolute: 0.2 10*3/uL (ref 0.0–0.5)
Eosinophils Relative: 2 %
HCT: 40.3 % (ref 36.0–46.0)
Hemoglobin: 13.5 g/dL (ref 12.0–15.0)
Immature Granulocytes: 0 %
Lymphocytes Relative: 26 %
Lymphs Abs: 2.6 10*3/uL (ref 0.7–4.0)
MCH: 29.1 pg (ref 26.0–34.0)
MCHC: 33.5 g/dL (ref 30.0–36.0)
MCV: 86.9 fL (ref 80.0–100.0)
Monocytes Absolute: 0.7 10*3/uL (ref 0.1–1.0)
Monocytes Relative: 7 %
Neutro Abs: 6.5 10*3/uL (ref 1.7–7.7)
Neutrophils Relative %: 65 %
Platelets: 284 10*3/uL (ref 150–400)
RBC: 4.64 MIL/uL (ref 3.87–5.11)
RDW: 12.9 % (ref 11.5–15.5)
WBC: 10 10*3/uL (ref 4.0–10.5)
nRBC: 0 % (ref 0.0–0.2)

## 2023-12-02 LAB — LACTIC ACID, PLASMA: Lactic Acid, Venous: 1.2 mmol/L (ref 0.5–1.9)

## 2023-12-02 LAB — RESP PANEL BY RT-PCR (RSV, FLU A&B, COVID)  RVPGX2
Influenza A by PCR: NEGATIVE
Influenza B by PCR: NEGATIVE
Resp Syncytial Virus by PCR: NEGATIVE
SARS Coronavirus 2 by RT PCR: NEGATIVE

## 2023-12-02 LAB — TROPONIN T, HIGH SENSITIVITY
Troponin T High Sensitivity: <15 ng/L (ref <19)
Troponin T High Sensitivity: <15 ng/L (ref <19)

## 2023-12-02 LAB — HEPARIN LEVEL (UNFRACTIONATED): Heparin Unfractionated: 0.32 [IU]/mL (ref 0.30–0.70)

## 2023-12-02 LAB — PRO BRAIN NATRIURETIC PEPTIDE: Pro Brain Natriuretic Peptide: 267 pg/mL (ref <300.0)

## 2023-12-02 MED ORDER — BISOPROLOL-HYDROCHLOROTHIAZIDE 5-6.25 MG PO TABS: 1.0000 | ORAL_TABLET | Freq: Every day | ORAL | Status: DC

## 2023-12-02 MED ORDER — BISOPROLOL FUMARATE 5 MG PO TABS
5.0000 mg | ORAL_TABLET | Freq: Every day | ORAL | Status: DC
  Administered 2023-12-03 – 2023-12-04 (×2): 5 mg via ORAL
  Filled 2023-12-02 (×2): qty 1

## 2023-12-02 MED ORDER — IOHEXOL 350 MG/ML SOLN
75.0000 mL | Freq: Once | INTRAVENOUS | Status: AC | PRN
  Administered 2023-12-02: 75 mL via INTRAVENOUS

## 2023-12-02 MED ORDER — HEPARIN (PORCINE) 25000 UT/250ML-% IV SOLN
1150.0000 [IU]/h | INTRAVENOUS | Status: DC
  Administered 2023-12-02: 1000 [IU]/h via INTRAVENOUS
  Administered 2023-12-03 – 2023-12-04 (×2): 1100 [IU]/h via INTRAVENOUS
  Filled 2023-12-02 (×3): qty 250

## 2023-12-02 MED ORDER — CITALOPRAM HYDROBROMIDE 20 MG PO TABS
20.0000 mg | ORAL_TABLET | Freq: Every day | ORAL | Status: DC
  Administered 2023-12-03: 20 mg via ORAL
  Filled 2023-12-02 (×2): qty 1

## 2023-12-02 MED ORDER — HEPARIN BOLUS VIA INFUSION
3000.0000 [IU] | Freq: Once | INTRAVENOUS | Status: AC
  Administered 2023-12-02: 3000 [IU] via INTRAVENOUS

## 2023-12-02 MED ORDER — HYDROCHLOROTHIAZIDE 12.5 MG PO TABS
6.2500 mg | ORAL_TABLET | Freq: Every day | ORAL | Status: DC
  Administered 2023-12-03 – 2023-12-04 (×2): 6.25 mg via ORAL
  Filled 2023-12-02 (×2): qty 1

## 2023-12-02 NOTE — Plan of Care (Signed)
 Hospital Medicine Transfer Accept Note Patient Name/Age: Lynn Walters / 89 y.o. MRN: [id number] Admission Date: 12/02/2023  Once successfully transferred to the appropriate floor, TRH will assume care for the patient above.  A/P: 30F h/o HTN, HLD, and anxiety p/w L cp and acute SOB and found to have acute L PE. Pt requiring supplemental O2 given desaturations with ambulation and admitted for ongoing care. EDP ordered heparin gtt. Pt transferred from [facility] to [facility]. Admit ordered placed.

## 2023-12-02 NOTE — Plan of Care (Addendum)
 New admit @1500  A&O x 4.  Heparin drip infusing at 10ml/hr. VSS stable, afebrile.  Regular diet.  Continent bladder and bowel.  Bed alarm in place. Call bell within reach.  Problem: Education: Goal: Knowledge of General Education information will improve Description: Including pain rating scale, medication(s)/side effects and non-pharmacologic comfort measures Outcome: Progressing   Problem: Health Behavior/Discharge Planning: Goal: Ability to manage health-related needs will improve Outcome: Progressing   Problem: Clinical Measurements: Goal: Ability to maintain clinical measurements within normal limits will improve Outcome: Progressing Goal: Will remain free from infection Outcome: Progressing Goal: Diagnostic test results will improve Outcome: Progressing Goal: Respiratory complications will improve Outcome: Progressing Goal: Cardiovascular complication will be avoided Outcome: Progressing

## 2023-12-02 NOTE — Progress Notes (Signed)
 PHARMACY - ANTICOAGULATION CONSULT NOTE  Pharmacy Consult for heparin Indication: pulmonary embolus  Allergies  Allergen Reactions   Amlodipine      Unknown. Thinks BP went up   Penicillins Hives and Rash   Shellfish Allergy Itching and Rash   Shellfish-Derived Products Rash    Patient Measurements: Weight: 63.2 kg (139 lb 5.3 oz)  Vital Signs: Temp: 98.7 F (37.1 C) (06/14 1035) BP: 141/56 (06/14 1226) Pulse Rate: 67 (06/14 1226)  Labs: Recent Labs    12/02/23 1115  HGB 13.5  HCT 40.3  PLT 284  CREATININE 0.74    CrCl cannot be calculated (Unknown ideal weight.).   Medical History: Past Medical History:  Diagnosis Date   Anxiety    Borderline diabetes    CAD (coronary artery disease)    CONSTIPATION 01/29/2010   Depression    DIVERTICULITIS, HX OF 09/24/2008   DIVERTICULOSIS OF COLON 03/23/2010   Dyslipidemia    Statin intolerant   Heart palpitations    HYPERLIPIDEMIA 09/24/2008   HYPERTENSION 09/24/2008   Hypertension    Kidney stones    Migraine    Palpitations    Essential     Assessment: 89 YOF presenting with CP, dyspnea on exertion, CT angio chest shows PE with no evidence of RHS.  She is not on anticoagulation PTA, CBC wnl  Goal of Therapy:  Heparin level 0.3-0.7 units/ml Monitor platelets by anticoagulation protocol: Yes   Plan:  Heparin 3000 units IV x 1, and gtt at 1000 units/hr F/u 8 hour heparin level F/u long term [facility] plan  Trinidad Funk, PharmD, [facility] Clinical Pharmacist ED Pharmacist Phone # [phone] 12/02/2023 1:03 PM

## 2023-12-02 NOTE — ED Provider Notes (Signed)
 [place] EMERGENCY DEPARTMENT AT [facility] Provider Note   CSN: [id number] Arrival date & time: 12/02/23  1016     Patient presents with: Shortness of Breath   Lynn Walters is a 89 y.o. female.   Patient with history of hypertension --presents to the emergency department today for left-sided chest pain and shortness of breath.  Patient reports having a fever about 1 week ago.  This resolved.  About 4 days ago she developed pain in her left side, starting down in her hip and then moving up into her lower back and lower rib area on the back.  She has had coughing, worse with deep breathing.  Pain is worse with deep breathing.  No recent fevers.  No lower extremity swelling.       Prior to Admission medications   Medication Sig Start Date End Date Taking? Authorizing Provider  bisoprolol -hydrochlorothiazide  (ZIAC ) 2.5-6.25 MG tablet Take 1 tablet by mouth 2 (two) times daily. 07/26/16   Liza Riggers, MD  Cholecalciferol (VITAMIN D) 2000 UNITS tablet Take 4,000 Units by mouth daily.    [provider]  citalopram (CELEXA) 40 MG tablet Take 40 mg by mouth daily. Pt takes 20 mg daily    [provider]  clorazepate  (TRANXENE ) 3.75 MG tablet TAKE 1 TABLET EVERY MORNING AND 1 TABLET EVERY EVENING, MAY TAKE AN ADDITIONAL TABLET MIDDAY AS NEEDED 04/20/15   Driscilla George, MD  Multiple Vitamins-Iron (MULTIVITAMIN/IRON PO) Take 1 tablet by mouth daily.     [provider]  naproxen  (NAPROSYN ) 375 MG tablet Take 1 tablet (375 mg total) by mouth 2 (two) times daily. 11/25/16   Lindle Rhea, MD    Allergies: Amlodipine , Penicillins, Shellfish allergy, and Shellfish-derived products    Review of Systems  Updated Vital Signs BP (!) 146/70 (BP Location: Right Arm)   Pulse 73   Temp 98.7 F (37.1 C)   Resp 20   SpO2 92%   Physical Exam Vitals and nursing note reviewed.  Constitutional:      General: She is not in acute distress.     Appearance: She is well-developed.  HENT:     Head: Normocephalic and atraumatic.     Right Ear: External ear normal.     Left Ear: External ear normal.     Nose: Nose normal.   Eyes:     Conjunctiva/sclera: Conjunctivae normal.    Cardiovascular:     Rate and Rhythm: Normal rate and regular rhythm.     Heart sounds: No murmur heard. Pulmonary:     Effort: No respiratory distress.     Breath sounds: Examination of the right-lower field reveals rales. Examination of the left-lower field reveals rales. Rales present. No wheezing or rhonchi.  Abdominal:     Palpations: Abdomen is soft.     Tenderness: There is no abdominal tenderness. There is no guarding or rebound.   Musculoskeletal:     Cervical back: Normal range of motion and neck supple.     Right lower leg: No edema.     Left lower leg: No edema.   Skin:    General: Skin is warm and dry.     Findings: No rash.   Neurological:     General: No focal deficit present.     Mental Status: She is alert. Mental status is at baseline.     Motor: No weakness.   Psychiatric:        Mood and Affect: Mood normal.  ED Course  Patient seen and examined. History obtained directly from patient.  EKG reviewed with Dr. Adrain Alar. Similar to 2017, will repeat.  Pulse ox 91-93% on room air. Repeat EKG reviewed with Dr. Adrain Alar, agree no STEMI.   Labs/EKG: Ordered CBC, CMP, BNP, troponin, lactate.  Imaging: Ordered chest x-ray.  Medications/Fluids: None ordered  Most recent vital signs reviewed and are as follows: BP (!) 146/70 (BP Location: Right Arm)   Pulse 73   Temp 98.7 F (37.1 C)   Resp 20   SpO2 92%   Initial impression: Left-sided posterior rib pain with cough  1:28 PM Reassessment performed. Patient appears stable.  She did have desaturation down to 85% while walking.  Patient currently on 2 L nasal cannula oxygen  for comfort.  Labs personally reviewed and interpreted including: CBC unremarkable; CMP calcium 10.6,  normal renal function, glucose 115; BNP was normal; troponin was normal x 2; lactate normal at 1.2; COVID-negative.  Imaging personally visualized and interpreted including: Imaging personally reviewed, evidence of left-sided pulmonary embolism with infarction  Reviewed pertinent lab work and imaging with patient at bedside. Questions answered.   Most current vital signs reviewed and are as follows: BP (!) 141/56   Pulse 67   Temp 98.7 F (37.1 C)   Resp 16   Wt 63.2 kg   SpO2 100%   BMI 24.68 kg/m   Plan: Radiologist did call and confirm results.  Heparin ordered.  I spoke with Dr. Sulema Endo with [facility] hospitalist who will accept for admission.  CRITICAL CARE Performed by: Lyna Sandhoff PA-C Total critical care time: 40 minutes Critical care time was exclusive of separately billable procedures and treating other patients. Critical care was necessary to treat or prevent imminent or life-threatening deterioration. Critical care was time spent personally by me on the following activities: development of treatment plan with patient and/or surrogate as well as nursing, discussions with consultants, evaluation of patient's response to treatment, examination of patient, obtaining history from patient or surrogate, ordering and performing treatments and interventions, ordering and review of laboratory studies, ordering and review of radiographic studies, pulse oximetry and re-evaluation of patient's condition.      (all labs ordered are listed, but only abnormal results are displayed) Labs Reviewed  COMPREHENSIVE METABOLIC PANEL WITH GFR - Abnormal; Notable for the following components:      Result Value   Chloride 97 (*)    Glucose, Bld 115 (*)    Calcium 10.6 (*)    Total Protein 8.5 (*)    All other components within normal limits  RESP PANEL BY RT-PCR (RSV, FLU A&B, COVID)  RVPGX2  CBC WITH DIFFERENTIAL/PLATELET  PRO BRAIN NATRIURETIC PEPTIDE  LACTIC ACID, PLASMA  HEPARIN LEVEL  (UNFRACTIONATED)  TROPONIN T, HIGH SENSITIVITY  TROPONIN T, HIGH SENSITIVITY    EKG: EKG Interpretation Date/Time:  Saturday December 02 2023 10:53:03 EDT Ventricular Rate:  71 PR Interval:  133 QRS Duration:  149 QT Interval:  421 QTC Calculation: 458 R Axis:   -74  Text Interpretation: Sinus rhythm RBBB and LAFB Left ventricular hypertrophy Confirmed by Rosealee Concha ([id number]) on 12/02/2023 11:46:04 AM  Radiology: CT Angio Chest PE W and/or Wo Contrast Result Date: 12/02/2023 CLINICAL DATA:  Dyspnea on exertion, hypoxia, left chest pain EXAM: CT ANGIOGRAPHY CHEST WITH CONTRAST TECHNIQUE: Multidetector CT imaging of the chest was performed using the standard protocol during bolus administration of intravenous contrast. Multiplanar CT image reconstructions and MIPs were obtained to evaluate the vascular anatomy. RADIATION DOSE  REDUCTION: This exam was performed according to the departmental dose-optimization program which includes automated exposure control, adjustment of the mA and/or kV according to patient size and/or use of iterative reconstruction technique. CONTRAST:  75mL OMNIPAQUE  IOHEXOL  350 MG/ML SOLN COMPARISON:  12/02/2023 FINDINGS: Cardiovascular: This is a technically adequate evaluation of the pulmonary vasculature. Large left-sided pulmonary emboli are identified, within the left main pulmonary artery as well as lobar and segmental branches of the left upper and left lower lobe. There is moderate clot burden. No evidence of right heart strain, with RV/LV ratio measuring 0.89. Incidental note is made of a left-sided superior vena cava emptying via the coronary sinus. Heart is unremarkable without pericardial effusion. Dense calcifications of the aortic valve. Ectasia of the ascending thoracic aorta measuring up to 3.8 cm, without frank aneurysm. No evidence of dissection. Atherosclerosis of the aorta and coronary vasculature. There is significant atheromatous plaque at the origin of the  left subclavian artery, stenosis estimated at 50-70%. Mediastinum/Nodes: No enlarged mediastinal, hilar, or axillary lymph nodes. Thyroid  gland, trachea, and esophagus demonstrate no significant findings. Lungs/Pleura: Basilar predominant interlobular septal thickening and fibrosis are noted, consistent with chronic interstitial lung disease. Wedge-shaped ground-glass consolidation superior segment left upper lobe may reflect developing pulmonary infarct. No acute airspace disease, effusion, or pneumothorax. Central airways are patent. Upper Abdomen: No acute abnormality. Musculoskeletal: No acute or destructive bony abnormalities. Reconstructed images demonstrate no additional findings. Review of the MIP images confirms the above findings. IMPRESSION: 1. Left-sided central, lobar, and segmental pulmonary emboli. Moderate clot burden with no evidence of right heart strain. 2. Left-sided SVC emptying via the coronary sinus, and anatomic variant. 3. Basilar predominant interstitial scarring and mild fibrosis. 4. Wedge-shaped ground-glass consolidation superior segment left upper lobe, favor developing infarct given left-sided pulmonary emboli. 5. Aortic Atherosclerosis (ICD10-I70.0). Coronary artery atherosclerosis. Critical Value/emergent results were called by telephone at the time of interpretation on 12/02/2023 at 12:55 pm to provider [facility] , who verbally acknowledged these results. Electronically Signed   By: Bobbye Burrow M.D.   On: 12/02/2023 12:55   DG Chest 2 View Result Date: 12/02/2023 CLINICAL DATA:  Shortness of breath EXAM: CHEST - 2 VIEW COMPARISON:  Chest x-ray performed August 15, 2017 FINDINGS: Mild diffuse interstitial prominence. Heart mediastinum are not significantly changed. No pleural effusion or focal lobar infiltrate. IMPRESSION: 1. No pleural effusion or focal lobar infiltrate. 2. Chronic appearing interstitial prominence, possibly sequelae of underlying chronic lung disease.  Electronically Signed   By: Reagan Camera M.D.   On: 12/02/2023 11:23     Procedures   Medications Ordered in the ED  heparin ADULT infusion 100 units/mL (25000 units/250mL) (1,000 Units/hr Intravenous New Bag/Given 12/02/23 1313)  iohexol  (OMNIPAQUE ) 350 MG/ML injection 75 mL (75 mLs Intravenous Contrast Given 12/02/23 1207)  heparin bolus via infusion 3,000 Units (3,000 Units Intravenous Bolus from Bag 12/02/23 1313)                                    Medical Decision Making Amount and/or Complexity of Data Reviewed Labs: ordered. Radiology: ordered.  Risk Prescription drug management. Decision regarding hospitalization.   For this patient's complaint of chest pain, the following emergent conditions were considered on the differential diagnosis: acute coronary syndrome, pulmonary embolism, pneumothorax, myocarditis, pericardial tamponade, aortic dissection, thoracic aortic aneurysm complication, esophageal perforation.   Other causes were also considered including: gastroesophageal reflux disease, musculoskeletal pain including  costochondritis, pneumonia/pleurisy, herpes zoster, pericarditis.  In regards to possibility of ACS, patient has atypical features of pain, non-ischemic and unchanged EKG and negative troponin(s).   In regards to possibility of PE, this was evaluated due to hypoxia, flank pain, lack of other obvious etiology.  CT was positive for pulmonary embolism.  Patient started on appropriate treatment.  Patient will be admitted for treatment of pulmonary embolism.      Final diagnoses:  Multiple subsegmental pulmonary emboli without acute cor pulmonale [facility])    ED Discharge Orders     None          Lyna Sandhoff, PA-C 12/02/23 1331    Rosealee Concha, MD 12/02/23 1334

## 2023-12-02 NOTE — ED Notes (Signed)
 Pt ambulated on RA with sats initially 93% and after ambulating for about 3-4 minutes pts sats dropped to 85%.  Some coughing noted, no SOB noted.  Pt stated she felt SOB after sitting back down.  HR 87-93, RR 18-20. Pt placed on 2L [place].  Sats increased to 92-94%.

## 2023-12-02 NOTE — Progress Notes (Signed)
 Admitted to room 6N26. Patient alert and oriented, Stable, no signs of respiratory distress, O2 at [facility] to bed, IVF heparin infusion ongoing. [facility] admit made aware of the admission via txt message.Awaiting for admission orders.

## 2023-12-02 NOTE — ED Triage Notes (Signed)
 C/o sharp pain in left chest when taking a deep breath. Recent illness with fever. Dyspnea on exertion.

## 2023-12-02 NOTE — H&P (Signed)
 History and Physical    Patient: Lynn Walters ZOX:[id number] DOB: [birth year] DOA: 12/02/2023 DOS: the patient was seen and examined on 12/02/2023 PCP: Jimmey Mould, MD  Patient coming from: Home  Chief Complaint:  Chief Complaint  Patient presents with   Shortness of Breath   HPI: Lynn Walters is a 89 y.o. female with medical history significant of HTN, HLD, and anxiety p/w acute L PE.  Pt states that she was in her USOH until the past two days when she noticed left sided chest pain when taking deep breaths. She became increasingly concerned when she had nightly fevers the past two nights, and her breathing worsened today; as such, she presented to the ED for evaluation.   In [facility] ED, pt AFVSS, but was placed on 2L [place] after CTA PE protocol showed acute L PE. Labs fairly unremarkable. CTA PE protocol showed left-sided central, lobar, and segmental pulmonary emboli w/o evidence of right heart strain. EDP started hep gtt. Pt admitted to medicine for ongoing care given O2 desaturations with ambulation.  Review of Systems: As mentioned in the history of present illness. All other systems reviewed and are negative. Past Medical History:  Diagnosis Date   Anxiety    Borderline diabetes    CAD (coronary artery disease)    CONSTIPATION 01/29/2010   Depression    DIVERTICULITIS, HX OF 09/24/2008   DIVERTICULOSIS OF COLON 03/23/2010   Dyslipidemia    Statin intolerant   Heart palpitations    HYPERLIPIDEMIA 09/24/2008   HYPERTENSION 09/24/2008   Hypertension    Kidney stones    Migraine    Palpitations    Essential   Past Surgical History:  Procedure Laterality Date   CARDIAC CATHETERIZATION  1992   Minimal LAD disease   CARDIOVASCULAR STRESS TEST  2008   No ischemia; EF 82%   CARDIOVASCULAR STRESS TEST  2011   Negative   Social History:  reports that she has never smoked. She has never used smokeless tobacco. She reports that she does not drink alcohol and does not use  drugs.  Allergies  Allergen Reactions   Amlodipine      Unknown. Thinks BP went up   Penicillins Hives and Rash   Shellfish Allergy Itching and Rash   Shellfish-Derived Products Rash    Family History  Problem Relation Age of Onset   Cancer Mother    Diabetes Father     Prior to Admission medications   Medication Sig Start Date End Date Taking? Authorizing Provider  bisoprolol -hydrochlorothiazide  (ZIAC ) 5-6.25 MG tablet Take 1 tablet by mouth daily. 11/02/23  Yes [provider]  Cholecalciferol (VITAMIN D) 2000 UNITS tablet Take 4,000 Units by mouth daily.    [provider]  citalopram (CELEXA) 20 MG tablet Take 20 mg by mouth daily.    [provider]  clorazepate  (TRANXENE ) 3.75 MG tablet TAKE 1 TABLET EVERY MORNING AND 1 TABLET EVERY EVENING, MAY TAKE AN ADDITIONAL TABLET MIDDAY AS NEEDED 04/20/15   Driscilla George, MD  Multiple Vitamins-Iron (MULTIVITAMIN/IRON PO) Take 1 tablet by mouth daily.     [provider]    Physical Exam: Vitals:   12/02/23 1330 12/02/23 1352 12/02/23 1400 12/02/23 1519  BP:    138/61  Pulse: 65 61 64 66  Resp: 16 14 14 18   Temp:    98.5 F (36.9 C)  TempSrc:    Oral  SpO2: 98% 98% 97% 99%  Weight:       General: Alert,  oriented x3, resting comfortably in no acute distress Respiratory: Lungs clear to auscultation bilaterally with normal respiratory effort; no w/r/r Cardiovascular: Regular rate and rhythm w/o m/r/g  Data Reviewed:  Lab Results  Component Value Date   WBC 10.0 12/02/2023   HGB 13.5 12/02/2023   HCT 40.3 12/02/2023   MCV 86.9 12/02/2023   PLT 284 12/02/2023   Lab Results  Component Value Date   GLUCOSE 115 (H) 12/02/2023   CALCIUM 10.6 (H) 12/02/2023   NA 136 12/02/2023   K 4.1 12/02/2023   CO2 26 12/02/2023   CL 97 (L) 12/02/2023   BUN 17 12/02/2023   CREATININE 0.74 12/02/2023   Lab Results  Component Value Date   ALT 7 12/02/2023   AST 18 12/02/2023   ALKPHOS 81  12/02/2023   BILITOT 0.4 12/02/2023   Lab Results  Component Value Date   INR 0.98 11/25/2016   INR 0.94 04/14/2010    Radiology: CT Angio Chest PE W and/or Wo Contrast Result Date: 12/02/2023 CLINICAL DATA:  Dyspnea on exertion, hypoxia, left chest pain EXAM: CT ANGIOGRAPHY CHEST WITH CONTRAST TECHNIQUE: Multidetector CT imaging of the chest was performed using the standard protocol during bolus administration of intravenous contrast. Multiplanar CT image reconstructions and MIPs were obtained to evaluate the vascular anatomy. RADIATION DOSE REDUCTION: This exam was performed according to the departmental dose-optimization program which includes automated exposure control, adjustment of the mA and/or kV according to patient size and/or use of iterative reconstruction technique. CONTRAST:  75mL OMNIPAQUE  IOHEXOL  350 MG/ML SOLN COMPARISON:  12/02/2023 FINDINGS: Cardiovascular: This is a technically adequate evaluation of the pulmonary vasculature. Large left-sided pulmonary emboli are identified, within the left main pulmonary artery as well as lobar and segmental branches of the left upper and left lower lobe. There is moderate clot burden. No evidence of right heart strain, with RV/LV ratio measuring 0.89. Incidental note is made of a left-sided superior vena cava emptying via the coronary sinus. Heart is unremarkable without pericardial effusion. Dense calcifications of the aortic valve. Ectasia of the ascending thoracic aorta measuring up to 3.8 cm, without frank aneurysm. No evidence of dissection. Atherosclerosis of the aorta and coronary vasculature. There is significant atheromatous plaque at the origin of the left subclavian artery, stenosis estimated at 50-70%. Mediastinum/Nodes: No enlarged mediastinal, hilar, or axillary lymph nodes. Thyroid  gland, trachea, and esophagus demonstrate no significant findings. Lungs/Pleura: Basilar predominant interlobular septal thickening and fibrosis are noted,  consistent with chronic interstitial lung disease. Wedge-shaped ground-glass consolidation superior segment left upper lobe may reflect developing pulmonary infarct. No acute airspace disease, effusion, or pneumothorax. Central airways are patent. Upper Abdomen: No acute abnormality. Musculoskeletal: No acute or destructive bony abnormalities. Reconstructed images demonstrate no additional findings. Review of the MIP images confirms the above findings. IMPRESSION: 1. Left-sided central, lobar, and segmental pulmonary emboli. Moderate clot burden with no evidence of right heart strain. 2. Left-sided SVC emptying via the coronary sinus, and anatomic variant. 3. Basilar predominant interstitial scarring and mild fibrosis. 4. Wedge-shaped ground-glass consolidation superior segment left upper lobe, favor developing infarct given left-sided pulmonary emboli. 5. Aortic Atherosclerosis (ICD10-I70.0). Coronary artery atherosclerosis. Critical Value/emergent results were called by telephone at the time of interpretation on 12/02/2023 at 12:55 pm to provider [facility] , who verbally acknowledged these results. Electronically Signed   By: Bobbye Burrow M.D.   On: 12/02/2023 12:55   DG Chest 2 View Result Date: 12/02/2023 CLINICAL DATA:  Shortness of breath EXAM: CHEST - 2 VIEW  COMPARISON:  Chest x-ray performed August 15, 2017 FINDINGS: Mild diffuse interstitial prominence. Heart mediastinum are not significantly changed. No pleural effusion or focal lobar infiltrate. IMPRESSION: 1. No pleural effusion or focal lobar infiltrate. 2. Chronic appearing interstitial prominence, possibly sequelae of underlying chronic lung disease. Electronically Signed   By: Reagan Camera M.D.   On: 12/02/2023 11:23    Assessment and Plan: 26F h/o HTN, HLD, and anxiety p/w acute L PE.  Acute L PE -Continue IV heparin gtt for now; will need DOAC at discharge -Wean O2 as tolerated -Ambulatory pulse ox prior to d/c  HTN -PTA Ziac   daily  Anxiety -PTA Celexa 20mg  daily   Advance Care Planning:   Code Status: Full Code   Consults: N/A  Family Communication: N/A  Severity of Illness: The appropriate patient status for this patient is OBSERVATION. Observation status is judged to be reasonable and necessary in order to provide the required intensity of service to ensure the patient's safety. The patient's presenting symptoms, physical exam findings, and initial radiographic and laboratory data in the context of their medical condition is felt to place them at decreased risk for further clinical deterioration. Furthermore, it is anticipated that the patient will be medically stable for discharge from the hospital within 2 midnights of admission.    ------- I spent 55 minutes reviewing previous labs/notes, obtaining separate history at the bedside, counseling/discussing the treatment plan outlined above, ordering medications/tests, and performing clinical documentation.  Author: Arne Langdon, MD 12/02/2023 3:50 PM  For on call review [email or web address].

## 2023-12-02 NOTE — ED Notes (Signed)
 Attempted to call report to [phone], with no answer

## 2023-12-02 NOTE — Progress Notes (Signed)
 PHARMACY - ANTICOAGULATION CONSULT NOTE  Pharmacy Consult for heparin Indication: pulmonary embolus  Allergies  Allergen Reactions   Norvasc  [Amlodipine ] Other (See Comments)    Pt thinks this medication caused the opposite effect and increased blood pressure   Penicillins Hives and Rash   Shellfish Allergy Itching and Rash   Shellfish-Derived Products Hives and Rash    Patient Measurements: Weight: 63.2 kg (139 lb 5.3 oz)  Vital Signs: Temp: 99.6 F (37.6 C) (06/14 2132) Temp Source: Oral (06/14 2132) BP: 128/55 (06/14 2132) Pulse Rate: 68 (06/14 2132)  Labs: Recent Labs    12/02/23 1115 12/02/23 2038  HGB 13.5  --   HCT 40.3  --   PLT 284  --   HEPARINUNFRC  --  0.32  CREATININE 0.74  --     CrCl cannot be calculated (Unknown ideal weight.).   Assessment: 22 YOF presenting with CP, dyspnea on exertion, CT angio chest shows PE with no evidence of RHS.  She is not on anticoagulation PTA, CBC wnl.  Heparin level is therapeutic at 0.32 (low end of goal) on 1000 units/hr. No bleeding noted.   Goal of Therapy:  Heparin level 0.3-0.7 units/ml Monitor platelets by anticoagulation protocol: Yes   Plan:  Increase heparin drip to 1100 units/hr to keep >0.3 units/ml Daily heparin level, CBC Monitor for s/sx of bleeding F/u long term AC plan  Thank you for involving pharmacy in this patient's care.  Caroline Cinnamon, PharmD, BCPS Clinical Pharmacist Clinical phone for 12/02/2023 is [phone] 12/02/2023 9:48 PM

## 2023-12-03 DIAGNOSIS — I2699 Other pulmonary embolism without acute cor pulmonale: Secondary | ICD-10-CM | POA: Diagnosis not present

## 2023-12-03 DIAGNOSIS — J9601 Acute respiratory failure with hypoxia: Secondary | ICD-10-CM | POA: Diagnosis not present

## 2023-12-03 DIAGNOSIS — I10 Essential (primary) hypertension: Secondary | ICD-10-CM

## 2023-12-03 LAB — BASIC METABOLIC PANEL WITH GFR
Anion gap: 6 (ref 5–15)
BUN: 12 mg/dL (ref 8–23)
CO2: 31 mmol/L (ref 22–32)
Calcium: 9.3 mg/dL (ref 8.9–10.3)
Chloride: 100 mmol/L (ref 98–111)
Creatinine, Ser: 0.83 mg/dL (ref 0.44–1.00)
GFR, Estimated: >60 mL/min (ref >60)
Glucose, Bld: 151 mg/dL — ABNORMAL HIGH (ref 70–99)
Potassium: 4 mmol/L (ref 3.5–5.1)
Sodium: 137 mmol/L (ref 135–145)

## 2023-12-03 LAB — HEMOGLOBIN A1C
Hgb A1c MFr Bld: 6.4 % — ABNORMAL HIGH (ref 4.8–5.6)
Mean Plasma Glucose: 136.98 mg/dL

## 2023-12-03 LAB — CBC
HCT: 40.8 % (ref 36.0–46.0)
Hemoglobin: 13.3 g/dL (ref 12.0–15.0)
MCH: 29.2 pg (ref 26.0–34.0)
MCHC: 32.6 g/dL (ref 30.0–36.0)
MCV: 89.5 fL (ref 80.0–100.0)
Platelets: 276 10*3/uL (ref 150–400)
RBC: 4.56 MIL/uL (ref 3.87–5.11)
RDW: 12.7 % (ref 11.5–15.5)
WBC: 7.1 10*3/uL (ref 4.0–10.5)
nRBC: 0 % (ref 0.0–0.2)

## 2023-12-03 LAB — MAGNESIUM: Magnesium: 2.1 mg/dL (ref 1.7–2.4)

## 2023-12-03 LAB — HEPARIN LEVEL (UNFRACTIONATED): Heparin Unfractionated: 0.47 [IU]/mL (ref 0.30–0.70)

## 2023-12-03 MED ORDER — HYDRALAZINE HCL 25 MG PO TABS: 25.0000 mg | ORAL_TABLET | Freq: Four times a day (QID) | ORAL | Status: DC | PRN

## 2023-12-03 NOTE — Progress Notes (Signed)
 PROGRESS NOTE  Lynn Walters JYN:[id number] DOB: 04-08-1928   PCP: Jimmey Mould, MD  Patient is from: Home.  Lives alone.  Independently ambulates at baseline.  DOA: 12/02/2023 LOS: 1  Chief complaints Chief Complaint  Patient presents with   Shortness of Breath     Brief Narrative / Interim history: 89 year old F with PMH of HTN, HLD and anxiety presenting with left-sided pleuritic chest pain, subjective fever and shortness of breath, and admitted with acute pulmonary embolism as noted on CT angio chest.  No radiologic evidence of RV strain.  No prior history of PE or DVT or specific risk factors other than age.  In ED, HDS but he desaturated to 86% on RA requiring 2 L by [place].  CMP and CBC without significant finding.  proBNP and serial troponin negative.  Started on IV heparin and admitted.   Desaturated to 88% with ambulation on room air and also felt dyspneic.  Remains on IV heparin.  Echocardiogram and lower extremity venous Doppler ordered.  Subjective: Seen and examined earlier this morning.  No major events overnight or this morning.  Sleepy but wakes to voice.  Oriented x 4.  Reports pleuritic left-sided chest pain with deep breathing.  Denies smoking cigarettes, prior VTE, history of cancer.  Objective: Vitals:   12/02/23 2132 12/03/23 0611 12/03/23 0633 12/03/23 0800  BP: (!) 128/55 (!) 133/52 (!) 133/52 (!) 145/73  Pulse: 68 64 64 77  Resp: 17 17 17 18   Temp: 99.6 F (37.6 C) 98.1 F (36.7 C) 98.1 F (36.7 C) 98 F (36.7 C)  TempSrc: Oral Oral Oral Oral  SpO2: 97% 98% 98% 91%  Weight:        Examination:  GENERAL: No apparent distress.  Nontoxic. HEENT: MMM.  Vision and hearing grossly intact.  NECK: Supple.  No apparent JVD.  RESP:  No IWOB.  Fair aeration bilaterally. CVS:  RRR. Heart sounds normal.  ABD/GI/GU: BS+. Abd soft, NTND.  MSK/EXT:  Moves extremities. No apparent deformity. No edema.  SKIN: no apparent skin lesion or wound NEURO: AA.   Oriented appropriately.  No apparent focal neuro deficit. PSYCH: Calm. Normal affect.   Consultants:  None  Procedures: None  Microbiology summarized: COVID-19, influenza and RSV PCR nonreactive  Assessment and plan: Acute pulmonary embolism without cor pulmonale: Unprovoked.  CT angio chest with left-sided central, lobar and segmental PE with moderate clot burden but no evidence of right heart strain, and wedge-shaped groundglass consolidation superior segment of left upper lobe favoring infarction.  proBNP and serial troponin negative.  HDS but requiring supplemental oxygen  to maintain appropriate saturation. -Check echocardiogram and lower extremity venous Doppler -Continue IV heparin  Acute respiratory failure with hypoxia: Due to pulmonary embolism with pulmonary infarction. -Management as above -Incentive spirometry, OOB - Wean oxygen  as able.  Daily ambulatory saturation  Essential hypertension: BP slightly elevated. -Continue home bisoprolol  and HCTZ - P.o. hydralazine as needed  Mood disorder: Stable - Continue home Celexa.  Hyperglycemia: No history of diabetes - Check hemoglobin A1c  Body mass index is 24.68 kg/m.           DVT prophylaxis:   On full dose anticoagulation Code Status: Full code Family Communication: None at bedside Level of care: Med-Surg Status is: Inpatient Remains inpatient appropriate because: Acute pulmonary embolism with respiratory failure   Final disposition: Home   55 minutes with more than 50% spent in reviewing records, counseling patient/family and coordinating care.   Sch Meds:  Scheduled  Meds:  bisoprolol   5 mg Oral Daily   And   hydrochlorothiazide   6.25 mg Oral Daily   citalopram  20 mg Oral Daily   Continuous Infusions:  heparin 1,100 Units/hr (12/03/23 1107)   PRN Meds:.  Antimicrobials: Anti-infectives (From admission, onward)    None        I have personally reviewed the following labs and  images: CBC: Recent Labs  Lab 12/02/23 1115 12/03/23 0911  WBC 10.0 7.1  NEUTROABS 6.5  --   HGB 13.5 13.3  HCT 40.3 40.8  MCV 86.9 89.5  PLT 284 276   BMP &GFR Recent Labs  Lab 12/02/23 1115 12/03/23 0911  NA 136 137  K 4.1 4.0  CL 97* 100  CO2 26 31  GLUCOSE 115* 151*  BUN 17 12  CREATININE 0.74 0.83  CALCIUM 10.6* 9.3  MG  --  2.1   CrCl cannot be calculated (Unknown ideal weight.). Liver & Pancreas: Recent Labs  Lab 12/02/23 1115  AST 18  ALT 7  ALKPHOS 81  BILITOT 0.4  PROT 8.5*  ALBUMIN 4.1   No results for input(s): LIPASE, AMYLASE in the last 168 hours. No results for input(s): AMMONIA in the last 168 hours. Diabetic: No results for input(s): HGBA1C in the last 72 hours. No results for input(s): GLUCAP in the last 168 hours. Cardiac Enzymes: No results for input(s): CKTOTAL, CKMB, CKMBINDEX, TROPONINI in the last 168 hours. Recent Labs    12/02/23 1115  PROBNP 267.0   Coagulation Profile: No results for input(s): INR, PROTIME in the last 168 hours. Thyroid  Function Tests: No results for input(s): TSH, T4TOTAL, FREET4, T3FREE, THYROIDAB in the last 72 hours. Lipid Profile: No results for input(s): CHOL, HDL, LDLCALC, TRIG, CHOLHDL, LDLDIRECT in the last 72 hours. Anemia Panel: No results for input(s): VITAMINB12, FOLATE, FERRITIN, TIBC, IRON, RETICCTPCT in the last 72 hours. Urine analysis:    Component Value Date/Time   COLORURINE YELLOW 04/06/2013 1750   APPEARANCEUR CLEAR 04/06/2013 1750   LABSPEC 1.016 04/06/2013 1750   PHURINE 5.5 04/06/2013 1750   GLUCOSEU NEGATIVE 04/06/2013 1750   HGBUR NEGATIVE 04/06/2013 1750   BILIRUBINUR NEGATIVE 04/06/2013 1750   KETONESUR NEGATIVE 04/06/2013 1750   PROTEINUR NEGATIVE 04/06/2013 1750   UROBILINOGEN 0.2 04/06/2013 1750   NITRITE NEGATIVE 04/06/2013 1750   LEUKOCYTESUR NEGATIVE 04/06/2013 1750   Sepsis Labs: Invalid input(s):  PROCALCITONIN, LACTICIDVEN  Microbiology: Recent Results (from the past 240 hours)  Resp panel by RT-PCR (RSV, Flu A&B, Covid) Anterior Nasal Swab     Status: None   Collection Time: 12/02/23 11:15 AM   Specimen: Anterior Nasal Swab  Result Value Ref Range Status   SARS Coronavirus 2 by RT PCR NEGATIVE NEGATIVE Final    Comment: (NOTE) SARS-CoV-2 target nucleic acids are NOT DETECTED.  The SARS-CoV-2 RNA is generally detectable in upper respiratory specimens during the acute phase of infection. The lowest concentration of SARS-CoV-2 viral copies this assay can detect is 138 copies/mL. A negative result does not preclude SARS-Cov-2 infection and should not be used as the sole basis for treatment or other patient management decisions. A negative result may occur with  improper specimen collection/handling, submission of specimen other than nasopharyngeal swab, presence of viral mutation(s) within the areas targeted by this assay, and inadequate number of viral copies(<138 copies/mL). A negative result must be combined with clinical observations, patient history, and epidemiological information. The expected result is Negative.  Fact Sheet for Patients:  BloggerCourse.com  Fact  Sheet for Healthcare Providers:  SeriousBroker.it  This test is no t yet approved or cleared by the [place]  FDA and  has been authorized for detection and/or diagnosis of SARS-CoV-2 by FDA under an Emergency Use Authorization (EUA). This EUA will remain  in effect (meaning this test can be used) for the duration of the COVID-19 declaration under Section 564(b)(1) of the Act, 21 U.S.C.section 360bbb-3(b)(1), unless the authorization is terminated  or revoked sooner.       Influenza A by PCR NEGATIVE NEGATIVE Final   Influenza B by PCR NEGATIVE NEGATIVE Final    Comment: (NOTE) The Xpert Xpress SARS-CoV-2/FLU/RSV plus assay is intended as an  aid in the diagnosis of influenza from Nasopharyngeal swab specimens and should not be used as a sole basis for treatment. Nasal washings and aspirates are unacceptable for Xpert Xpress SARS-CoV-2/FLU/RSV testing.  Fact Sheet for Patients: BloggerCourse.com  Fact Sheet for Healthcare Providers: SeriousBroker.it  This test is not yet approved or cleared by the [place]  FDA and has been authorized for detection and/or diagnosis of SARS-CoV-2 by FDA under an Emergency Use Authorization (EUA). This EUA will remain in effect (meaning this test can be used) for the duration of the COVID-19 declaration under Section 564(b)(1) of the Act, 21 U.S.C. section 360bbb-3(b)(1), unless the authorization is terminated or revoked.     Resp Syncytial Virus by PCR NEGATIVE NEGATIVE Final    Comment: (NOTE) Fact Sheet for Patients: BloggerCourse.com  Fact Sheet for Healthcare Providers: SeriousBroker.it  This test is not yet approved or cleared by the [place]  FDA and has been authorized for detection and/or diagnosis of SARS-CoV-2 by FDA under an Emergency Use Authorization (EUA). This EUA will remain in effect (meaning this test can be used) for the duration of the COVID-19 declaration under Section 564(b)(1) of the Act, 21 U.S.C. section 360bbb-3(b)(1), unless the authorization is terminated or revoked.  Performed at [facility], [address] [place], [place] [address]     Radiology Studies: CT Angio Chest PE W and/or Wo Contrast Result Date: 12/02/2023 CLINICAL DATA:  Dyspnea on exertion, hypoxia, left chest pain EXAM: CT ANGIOGRAPHY CHEST WITH CONTRAST TECHNIQUE: Multidetector CT imaging of the chest was performed using the standard protocol during bolus administration of intravenous contrast. Multiplanar CT image reconstructions and MIPs were obtained to  evaluate the vascular anatomy. RADIATION DOSE REDUCTION: This exam was performed according to the departmental dose-optimization program which includes automated exposure control, adjustment of the mA and/or kV according to patient size and/or use of iterative reconstruction technique. CONTRAST:  75mL OMNIPAQUE  IOHEXOL  350 MG/ML SOLN COMPARISON:  12/02/2023 FINDINGS: Cardiovascular: This is a technically adequate evaluation of the pulmonary vasculature. Large left-sided pulmonary emboli are identified, within the left main pulmonary artery as well as lobar and segmental branches of the left upper and left lower lobe. There is moderate clot burden. No evidence of right heart strain, with RV/LV ratio measuring 0.89. Incidental note is made of a left-sided superior vena cava emptying via the coronary sinus. Heart is unremarkable without pericardial effusion. Dense calcifications of the aortic valve. Ectasia of the ascending thoracic aorta measuring up to 3.8 cm, without frank aneurysm. No evidence of dissection. Atherosclerosis of the aorta and coronary vasculature. There is significant atheromatous plaque at the origin of the left subclavian artery, stenosis estimated at 50-70%. Mediastinum/Nodes: No enlarged mediastinal, hilar, or axillary lymph nodes. Thyroid  gland, trachea, and esophagus demonstrate no significant findings. Lungs/Pleura: Basilar predominant interlobular septal thickening and fibrosis  are noted, consistent with chronic interstitial lung disease. Wedge-shaped ground-glass consolidation superior segment left upper lobe may reflect developing pulmonary infarct. No acute airspace disease, effusion, or pneumothorax. Central airways are patent. Upper Abdomen: No acute abnormality. Musculoskeletal: No acute or destructive bony abnormalities. Reconstructed images demonstrate no additional findings. Review of the MIP images confirms the above findings. IMPRESSION: 1. Left-sided central, lobar, and segmental  pulmonary emboli. Moderate clot burden with no evidence of right heart strain. 2. Left-sided SVC emptying via the coronary sinus, and anatomic variant. 3. Basilar predominant interstitial scarring and mild fibrosis. 4. Wedge-shaped ground-glass consolidation superior segment left upper lobe, favor developing infarct given left-sided pulmonary emboli. 5. Aortic Atherosclerosis (ICD10-I70.0). Coronary artery atherosclerosis. Critical Value/emergent results were called by telephone at the time of interpretation on 12/02/2023 at 12:55 pm to provider [facility] , who verbally acknowledged these results. Electronically Signed   By: Bobbye Burrow M.D.   On: 12/02/2023 12:55      Milton Streicher [facility]  If 7PM-7AM, please contact night-coverage [email or web address] 12/03/2023, 11:39 AM

## 2023-12-03 NOTE — Progress Notes (Signed)
 Please call daughter Caryl Clas for any updates, concerns or questions.  [phone]

## 2023-12-03 NOTE — Plan of Care (Signed)

## 2023-12-03 NOTE — Progress Notes (Signed)
 PHARMACY - ANTICOAGULATION CONSULT NOTE  Pharmacy Consult for heparin Indication: pulmonary embolus  Allergies  Allergen Reactions   Norvasc  [Amlodipine ] Other (See Comments)    Pt thinks this medication caused the opposite effect and increased blood pressure   Penicillins Hives and Rash   Shellfish Allergy Itching and Rash   Shellfish-Derived Products Hives and Rash    Patient Measurements: Weight: 63.2 kg (139 lb 5.3 oz)  Vital Signs: Temp: 98 F (36.7 C) (06/15 0800) Temp Source: Oral (06/15 0800) BP: 145/73 (06/15 0800) Pulse Rate: 77 (06/15 0800)  Labs: Recent Labs    12/02/23 1115 12/02/23 2038 12/03/23 0911  HGB 13.5  --  13.3  HCT 40.3  --  40.8  PLT 284  --  276  HEPARINUNFRC  --  0.32 0.47  CREATININE 0.74  --  0.83    CrCl cannot be calculated (Unknown ideal weight.).  Assessment: 24 YOF presenting with CP, dyspnea on exertion, CT angio chest shows PE with no evidence of RHS.  She is not on anticoagulation PTA, CBC wnl.  6/15 1000: Heparin level 0.47, therapeutic on heparin 1100 units/hr. No issues with infusion running or signs of bleeding per RN. CBC stable (Hgb 13.3, PLT 276).  Goal of Therapy:  Heparin level 0.3-0.7 units/ml Monitor platelets by anticoagulation protocol: Yes   Plan:  Continue heparin drip 1100 units/hr Daily heparin level, CBC Monitor for s/sx of bleeding F/u long term AC plan  Thank you for involving pharmacy in this patient's care.  Volney Grumbles, PharmD PGY-1 Acute Care Pharmacy Resident 12/03/2023 10:25 AM

## 2023-12-04 ENCOUNTER — Encounter (HOSPITAL_COMMUNITY): Payer: Self-pay | Admitting: Hospitalist

## 2023-12-04 ENCOUNTER — Inpatient Hospital Stay (HOSPITAL_COMMUNITY)

## 2023-12-04 ENCOUNTER — Other Ambulatory Visit: Payer: Self-pay

## 2023-12-04 ENCOUNTER — Other Ambulatory Visit (HOSPITAL_COMMUNITY): Payer: Self-pay

## 2023-12-04 ENCOUNTER — Telehealth (HOSPITAL_COMMUNITY): Payer: Self-pay | Admitting: Pharmacy Technician

## 2023-12-04 ENCOUNTER — Encounter (HOSPITAL_COMMUNITY)

## 2023-12-04 DIAGNOSIS — F32A Depression, unspecified: Secondary | ICD-10-CM | POA: Diagnosis not present

## 2023-12-04 DIAGNOSIS — R008 Other abnormalities of heart beat: Secondary | ICD-10-CM

## 2023-12-04 DIAGNOSIS — J9601 Acute respiratory failure with hypoxia: Secondary | ICD-10-CM | POA: Diagnosis not present

## 2023-12-04 DIAGNOSIS — I2699 Other pulmonary embolism without acute cor pulmonale: Secondary | ICD-10-CM | POA: Diagnosis not present

## 2023-12-04 LAB — RENAL FUNCTION PANEL
Albumin: 3.3 g/dL — ABNORMAL LOW (ref 3.5–5.0)
Anion gap: 10 (ref 5–15)
BUN: 13 mg/dL (ref 8–23)
CO2: 29 mmol/L (ref 22–32)
Calcium: 9.6 mg/dL (ref 8.9–10.3)
Chloride: 99 mmol/L (ref 98–111)
Creatinine, Ser: 0.7 mg/dL (ref 0.44–1.00)
GFR, Estimated: >60 mL/min (ref >60)
Glucose, Bld: 109 mg/dL — ABNORMAL HIGH (ref 70–99)
Phosphorus: 3.2 mg/dL (ref 2.5–4.6)
Potassium: 4.1 mmol/L (ref 3.5–5.1)
Sodium: 138 mmol/L (ref 135–145)

## 2023-12-04 LAB — CBC
HCT: 40.9 % (ref 36.0–46.0)
Hemoglobin: 13.5 g/dL (ref 12.0–15.0)
MCH: 29.5 pg (ref 26.0–34.0)
MCHC: 33 g/dL (ref 30.0–36.0)
MCV: 89.3 fL (ref 80.0–100.0)
Platelets: 309 10*3/uL (ref 150–400)
RBC: 4.58 MIL/uL (ref 3.87–5.11)
RDW: 12.7 % (ref 11.5–15.5)
WBC: 8.5 10*3/uL (ref 4.0–10.5)
nRBC: 0 % (ref 0.0–0.2)

## 2023-12-04 LAB — ECHOCARDIOGRAM COMPLETE
AR max vel: 1.13 cm2
AV Area VTI: 1.16 cm2
AV Area mean vel: 1.09 cm2
AV Mean grad: 11 mmHg
AV Peak grad: 21.2 mmHg
Ao pk vel: 2.3 m/s
Area-P 1/2: 3.06 cm2
Calc EF: 70.3 %
MV VTI: 1.58 cm2
S' Lateral: 2 cm
Single Plane A2C EF: 71 %
Single Plane A4C EF: 69 %
Weight: 2229.29 [oz_av]

## 2023-12-04 LAB — MAGNESIUM: Magnesium: 2 mg/dL (ref 1.7–2.4)

## 2023-12-04 LAB — HEPARIN LEVEL (UNFRACTIONATED): Heparin Unfractionated: 0.48 [IU]/mL (ref 0.30–0.70)

## 2023-12-04 MED ORDER — APIXABAN 5 MG PO TABS: 5.0000 mg | ORAL_TABLET | Freq: Two times a day (BID) | ORAL | 0 refills | Status: DC

## 2023-12-04 MED ORDER — APIXABAN 5 MG PO TABS: 5.0000 mg | ORAL_TABLET | Freq: Two times a day (BID) | ORAL | Status: DC

## 2023-12-04 MED ORDER — APIXABAN 5 MG PO TABS
10.0000 mg | ORAL_TABLET | Freq: Two times a day (BID) | ORAL | Status: DC
  Administered 2023-12-04: 10 mg via ORAL
  Filled 2023-12-04: qty 2

## 2023-12-04 MED ORDER — APIXABAN 5 MG PO TABS: 5.0000 mg | ORAL_TABLET | Freq: Two times a day (BID) | ORAL | 2 refills | Status: DC

## 2023-12-04 MED ORDER — APIXABAN (ELIQUIS) VTE STARTER PACK (10MG AND 5MG)
ORAL_TABLET | ORAL | 0 refills | Status: DC
  Filled 2023-12-04: qty 74, 30d supply, fill #0

## 2023-12-04 MED ORDER — APIXABAN 5 MG PO TABS
ORAL_TABLET | ORAL | 0 refills | Status: AC
  Filled 2023-12-04: qty 70, 30d supply, fill #0

## 2023-12-04 NOTE — Progress Notes (Addendum)
 PHARMACY - ANTICOAGULATION CONSULT NOTE  Pharmacy Consult for heparin Indication: pulmonary embolus  Allergies  Allergen Reactions   Norvasc  Horst.Hasten ] Other (See Comments)    Pt thinks this medication caused the opposite effect and increased blood pressure   Penicillins Hives and Rash   Shellfish Allergy Itching and Rash   Shellfish-Derived Products Hives and Rash    Patient Measurements: Weight: 63.2 kg (139 lb 5.3 oz)  Vital Signs: Temp: 98 F (36.7 C) (06/16 0815) Temp Source: Oral (06/16 0815) BP: 160/64 (06/16 0815) Pulse Rate: 71 (06/16 0815)  Labs: Recent Labs    12/02/23 1115 12/02/23 2038 12/03/23 0911 12/04/23 0537  HGB 13.5  --  13.3 13.5  HCT 40.3  --  40.8 40.9  PLT 284  --  276 309  HEPARINUNFRC  --  0.32 0.47 0.48  CREATININE 0.74  --  0.83 0.70    CrCl cannot be calculated (Unknown ideal weight.).  Assessment: 28 YOF presenting with CP, dyspnea on exertion, CT angio chest shows moderate segmental PE with no evidence of RHS.  She is not on anticoagulation PTA, CBC wnl.  Heparin level 0.48 is therapeutic on 1100 units/hr. CBC stable   Goal of Therapy:  Heparin level 0.3-0.7 units/ml Monitor platelets by anticoagulation protocol: Yes   Plan:  Increase heparin slightly 1150 units/hr for higher end of goal  Daily heparin level, CBC Monitor for s/sx of bleeding F/u long term AC plan   ADDENDUM 13:30: Received consult for apixaban. Stop heaprin. Start apixaban 10mg  BID for 5 days (2 therapeutic days on heparin) then decrease to 5mg  BID   Thank you for involving pharmacy in this patient's care.  Dorene Gang, PharmD, BCPS, BCCP Clinical Pharmacist  Please check AMION for all [facility] phone numbers After 10:00 PM, call Main Pharmacy [phone]

## 2023-12-04 NOTE — Telephone Encounter (Signed)
 Patient Product/process development scientist completed.    The patient is insured through [facility]. Patient has Medicare and is not eligible for a copay card, but may be able to apply for patient assistance or Medicare RX Payment Plan (Patient Must reach out to their plan, if eligible for payment plan), if available.    Ran test claim for Eliquis 5 mg and the current 30 day co-pay is $112.62.  Ran test claim for Xarelto 20 mg and the current 30 day co-pay is $111.09.  This test claim was processed through [facility]- copay amounts may vary at other pharmacies due to pharmacy/plan contracts, or as the patient moves through the different stages of their insurance plan.     Morgan Arab, CPHT Pharmacy Technician III Certified Patient Advocate [facility] Patient Advocate Team Direct Number: [phone]  Fax: [phone]

## 2023-12-04 NOTE — Plan of Care (Signed)

## 2023-12-04 NOTE — Plan of Care (Signed)

## 2023-12-04 NOTE — Progress Notes (Signed)
SATURATION QUALIFICATIONS: (This note is used to comply with regulatory documentation for home oxygen)  Patient Saturations on Room Air at Rest = 95%  Patient Saturations on Room Air while Ambulating = 96%  Please briefly explain why patient needs home oxygen:

## 2023-12-04 NOTE — Discharge Instructions (Addendum)
 For your Eliquis (new blood thinner) medication, you will get 30 days worth from the [facility].   A separate prescription was sent to [facility] at [facility], [place] - [address] phone [phone] - which might be able to mail it to you for a low cost. They will probably call you soon. If not, please call them to ask about the cost.   We also sent an application for assistance from the Eliquis Manufacturer for you to bring to your primary care doctor. Your doctor needs to sign it and fax it per the instructions. If approved, you can get free medication mailed to your home from the drug company.   =====================================================================  Information on my medicine - ELIQUIS (apixaban)    Why was Eliquis prescribed for you? Eliquis was prescribed to treat blood clots that may have been found in the veins of your legs (deep vein thrombosis) or in your lungs (pulmonary embolism) and to reduce the risk of them occurring again.  What do You need to know about Eliquis ? The starting dose is 10 mg (two 5 mg tablets) taken TWICE daily for the FIRST SEVEN (7) DAYS, then on  12/09/23  the dose is reduced to ONE 5 mg tablet taken TWICE daily.  Eliquis may be taken with or without food.   Try to take the dose about the same time in the morning and in the evening. If you have difficulty swallowing the tablet whole please discuss with your pharmacist how to take the medication safely.  Take Eliquis exactly as prescribed and DO NOT stop taking Eliquis without talking to the doctor who prescribed the medication.  Stopping may increase your risk of developing a new blood clot.  Refill your prescription before you run out.  After discharge, you should have regular check-up appointments with your healthcare provider that is prescribing your Eliquis.    What do you do if you miss a dose? If a dose of ELIQUIS is not taken at the  scheduled time, take it as soon as possible on the same day and twice-daily administration should be resumed. The dose should not be doubled to make up for a missed dose.  Important Safety Information A possible side effect of Eliquis is bleeding. You should call your healthcare provider right away if you experience any of the following: Bleeding from an injury or your nose that does not stop. Unusual colored urine (red or dark brown) or unusual colored stools (red or black). Unusual bruising for unknown reasons. A serious fall or if you hit your head (even if there is no bleeding).  Some medicines may interact with Eliquis and might increase your risk of bleeding or clotting while on Eliquis. To help avoid this, consult your healthcare provider or pharmacist prior to using any new prescription or non-prescription medications, including herbals, vitamins, non-steroidal anti-inflammatory drugs (NSAIDs) and supplements.  This website has more information on Eliquis (apixaban): http://www.eliquis.com/eliquis/home ==========================================  Pulmonary Embolism    A pulmonary embolism (PE) is a sudden blockage or decrease of blood flow in one or both lungs. Most blockages come from a blood clot that forms in the vein of a lower leg, thigh, or arm (deep vein thrombosis, DVT) and travels to the lungs. A clot is blood that has thickened into a gel or solid. PE is a dangerous and life-threatening condition that needs to be treated right away.  What are the causes? This condition is usually caused  by a blood clot that forms in a vein and moves to the lungs. In rare cases, it may be caused by air, fat, part of a tumor, or other tissue that moves through the veins and into the lungs.  What increases the risk? The following factors may make you more likely to develop this condition: Experiencing a traumatic injury, such as breaking a hip or leg. Having: A spinal cord  injury. Orthopedic surgery, especially hip or knee replacement. Any major surgery. A stroke. DVT. Blood clots or blood clotting disease. Long-term (chronic) lung or heart disease. Cancer treated with chemotherapy. A central venous catheter. Taking medicines that contain estrogen. These include birth control pills and hormone replacement therapy. Being: Pregnant. In the period of time after your baby is delivered (postpartum). Older than age 77. Overweight. A smoker, especially if you have other risks.  What are the signs or symptoms? Symptoms of this condition usually start suddenly and include: Shortness of breath during activity or at rest. Coughing, coughing up blood, or coughing up blood-tinged mucus. Chest pain that is often worse with deep breaths. Rapid or irregular heartbeat. Feeling light-headed or dizzy. Fainting. Feeling anxious. Fever. Sweating. Pain and swelling in a leg. This is a symptom of DVT, which can lead to PE. How is this diagnosed? This condition may be diagnosed based on: Your medical history. A physical exam. Blood tests. CT pulmonary angiogram. This test checks blood flow in and around your lungs. Ventilation-perfusion scan, also called a lung VQ scan. This test measures air flow and blood flow to the lungs. An ultrasound of the legs.  How is this treated? Treatment for this condition depends on many factors, such as the cause of your PE, your risk for bleeding or developing more clots, and other medical conditions you have. Treatment aims to remove, dissolve, or stop blood clots from forming or growing larger. Treatment may include: Medicines, such as: Blood thinning medicines (anticoagulants) to stop clots from forming. Medicines that dissolve clots (thrombolytics). Procedures, such as: Using a flexible tube to remove a blood clot (embolectomy) or to deliver medicine to destroy it (catheter-directed thrombolysis). Inserting a filter into a  large vein that carries blood to the heart (inferior vena cava). This filter (vena cava filter) catches blood clots before they reach the lungs. Surgery to remove the clot (surgical embolectomy). This is rare. You may need a combination of immediate, long-term (up to 3 months after diagnosis), and extended (more than 3 months after diagnosis) treatments. Your treatment may continue for several months (maintenance therapy). You and your health care provider will work together to choose the treatment program that is best for you.  Follow these instructions at home: Medicines Take over-the-counter and prescription medicines only as told by your health care provider. If you are taking an anticoagulant medicine: Take the medicine every day at the same time each day. Understand what foods and drugs interact with your medicine. Understand the side effects of this medicine, including excessive bruising or bleeding. Ask your health care provider or pharmacist about other side effects.  General instructions Wear a medical alert bracelet or carry a medical alert card that says you have had a PE and lists what medicines you take. Ask your health care provider when you may return to your normal activities. Avoid sitting or lying for a long time without moving. Maintain a healthy weight. Ask your health care provider what weight is healthy for you. Do not use any products that contain nicotine or  tobacco, such as cigarettes, e-cigarettes, and chewing tobacco. If you need help quitting, ask your health care provider. Talk with your health care provider about any travel plans. It is important to make sure that you are still able to take your medicine while on trips. Keep all follow-up visits as told by your health care provider. This is important.  Contact a health care provider if: You missed a dose of your blood thinner medicine.  Get help right away if: You have: New or increased pain, swelling, warmth,  or redness in an arm or leg. Numbness or tingling in an arm or leg. Shortness of breath during activity or at rest. A fever. Chest pain. A rapid or irregular heartbeat. A severe headache. Vision changes. A serious fall or accident, or you hit your head. Stomach (abdominal) pain. Blood in your vomit, stool, or urine. A cut that will not stop bleeding. You cough up blood. You feel light-headed or dizzy. You cannot move your arms or legs. You are confused or have memory loss.  These symptoms may represent a serious problem that is an emergency. Do not wait to see if the symptoms will go away. Get medical help right away. Call your local emergency services (911 in the [place] Do not drive yourself to the hospital. Summary A pulmonary embolism (PE) is a sudden blockage or decrease of blood flow in one or both lungs. PE is a dangerous and life-threatening condition that needs to be treated right away. Treatments for this condition usually include medicines to thin your blood (anticoagulants) or medicines to break apart blood clots (thrombolytics). If you are given blood thinners, it is important to take the medicine every day at the same time each day. Understand what foods and drugs interact with any medicines that you are taking. If you have signs of PE or DVT, call your local emergency services (911 in the [place] This information is not intended to replace advice given to you by your health care provider. Make sure you discuss any questions you have with your health care provider. Document Revised: 03/14/2018 Document Reviewed: 03/14/2018 Elsevier Patient Education  2020 [employer].

## 2023-12-04 NOTE — Progress Notes (Signed)
*  PRELIMINARY RESULTS* Echocardiogram 2D Echocardiogram has been performed.  Lynn Walters 12/04/2023, 9:38 AM

## 2023-12-04 NOTE — Discharge Summary (Signed)
 Physician Discharge Summary  Lynn Walters ZOX:[id number] DOB: 21-Dec-1927 DOA: 12/02/2023  PCP: Jimmey Mould, MD  Admit date: 12/02/2023 Discharge date: 12/04/23  Admitted From: Home Disposition: Home Recommendations for Outpatient Follow-up:  Follow up with PCP in 1 week Check CMP and CBC at follow-up Please follow up on the following pending results: None  Home Health: No need identified Equipment/Devices: No need identified  Discharge Condition: Stable CODE STATUS: Full code  Follow-up Information     Jimmey Mould, MD. Schedule an appointment as soon as possible for a visit in 1 week(s).   Specialty: Family Medicine Contact information: [address] [place] [address] [phone]                 Hospital course 89 year old F with PMH of HTN, HLD and anxiety presenting with left-sided pleuritic chest pain, subjective fever and shortness of breath, and admitted with acute pulmonary embolism as noted on CT angio chest.  No radiologic evidence of RV strain.  No prior history of PE or DVT or specific risk factors other than age.  In ED, HDS but he desaturated to 86% on RA requiring 2 L by [place].  CMP and CBC without significant finding.  proBNP and serial troponin negative.  Started on IV heparin and admitted.    The next day, desaturated to 88% with ambulation on room air and also felt dyspneic.  Remains on IV heparin.  Echocardiogram without significant finding.  Lower extremity venous Doppler ordered.   On the day of discharge, patient remained stable.  Respiratory failure resolved.  Ambulated on room air and maintain saturation of 96% without respiratory distress.  She was transition to p.o. Eliquis and discharged on starter pack Eliquis.  Lower extremity venous Doppler deferred since it will not change the management.  She has no leg swelling or calf tenderness on exam.  See individual problem list below for more.   Problems addressed during  this hospitalization Principal Problem:   Acute pulmonary embolism (HCC) Active Problems:   Depression   Acute respiratory failure with hypoxia (HCC)              Time spent 35 minutes  Vital signs Vitals:   12/04/23 1139 12/04/23 1140 12/04/23 1145 12/04/23 1300  BP:      Pulse: (!) 59 65 76   Temp:      Resp:      Height:    5' 3 (1.6 m)  Weight:      SpO2: 95% 96% 91%   TempSrc:         Discharge exam  GENERAL: No apparent distress.  Nontoxic. HEENT: MMM.  Vision and hearing grossly intact.  NECK: Supple.  No apparent JVD.  RESP:  No IWOB.  Fair aeration bilaterally. CVS:  RRR. Heart sounds normal.  ABD/GI/GU: BS+. Abd soft, NTND.  MSK/EXT:  Moves extremities. No apparent deformity. No edema.  SKIN: no apparent skin lesion or wound NEURO: Awake and alert. Oriented appropriately.  No apparent focal neuro deficit. PSYCH: Calm. Normal affect.   Discharge Instructions Discharge Instructions     Diet - low sodium heart healthy   Complete by: As directed    Discharge instructions   Complete by: As directed    It has been a pleasure taking care of you!  You were hospitalized with acute pulmonary embolism (blood clot in the left lung).  Unclear what caused the blood clot.  We have started you on blood thinner.  It is very important that you take this medication as prescribed.  Avoid any over-the-counter pain medication other than plain Tylenol  while taking blood thinner.  Monitor for evidence of bleeding such as bright red blood in stool or dark tarry looking stool.  Follow-up with your primary care doctor in 1 to 2 weeks or sooner if needed.   Take care,   Increase activity slowly   Complete by: As directed       Allergies as of 12/04/2023       Reactions   Norvasc  [amlodipine ] Other (See Comments)   Pt thinks this medication caused the opposite effect and increased blood pressure   Penicillins Hives, Rash   Shellfish Allergy Itching, Rash    Shellfish-derived Products Hives, Rash        Medication List     TAKE these medications    bisoprolol -hydrochlorothiazide  5-6.25 MG tablet Commonly known as: ZIAC  Take 1 tablet by mouth daily.   citalopram 20 MG tablet Commonly known as: CELEXA Take 20 mg by mouth daily.   clorazepate  3.75 MG tablet Commonly known as: TRANXENE  TAKE 1 TABLET EVERY MORNING AND 1 TABLET EVERY EVENING, MAY TAKE AN ADDITIONAL TABLET MIDDAY AS NEEDED What changed: See the new instructions.   Eliquis 5 MG Tabs tablet Generic drug: apixaban 6/16 - 6/20: Take 2 tablets (10mg ) by mouth twice daily Starting on 6/21 and on: Take 1 tablet (5mg ) by mouth twice daily   Multivitamin Women 50+ Tabs Take 1 tablet by mouth daily.   Vitamin D 50 MCG (2000 UT) tablet Take 4,000 Units by mouth daily.        Consultations: None  Procedures/Studies:   ECHOCARDIOGRAM COMPLETE Result Date: 12/04/2023    ECHOCARDIOGRAM REPORT   Patient Name:   Lynn Walters Date of Exam: 12/04/2023 Medical Rec #:  [id number]        Height:       63.0 in Accession #:    [id number]       Weight:       139.3 lb Date of Birth:  Nov 14, 1927        BSA:          1.658 m Patient Age:    89 years         BP:           162/64 mmHg Patient Gender: F                HR:           66 bpm. Exam Location:  Inpatient Procedure: 2D Echo, Color Doppler and Cardiac Doppler (Both Spectral and Color            Flow Doppler were utilized during procedure). Indications:    R00.8 Other abnormalities of heart beat  History:        Patient has no prior history of Echocardiogram examinations.  Sonographer:    Andrena Bang Referring Phys: [id number] Donta Mcinroy T Malakhai Beitler IMPRESSIONS  1. Mild intracavitary gradient. Peak velocity 1.3 m/s. Peak gradient 7 mmHg. With Valsalva peak velocity increases to 1.7 m/s and peak gradient 12 mmHg. Left ventricular ejection fraction, by estimation, is 70 to 75%. The left ventricle has hyperdynamic  function. The left ventricle has no  regional wall motion abnormalities. Left ventricular diastolic parameters are consistent with Grade I diastolic dysfunction (impaired relaxation).  2. Right ventricular systolic function is normal. The right ventricular size is normal. There is normal pulmonary artery systolic pressure.  3. Mobile mitral  annular calcification. The mitral valve is degenerative. No evidence of mitral valve regurgitation. No evidence of mitral stenosis.  4. The aortic valve is tricuspid. There is mild calcification of the aortic valve. There is mild thickening of the aortic valve. Aortic valve regurgitation is not visualized. Mild aortic valve stenosis. Aortic valve area, by VTI measures 1.16 cm. Aortic valve mean gradient measures 11.0 mmHg. Aortic valve Vmax measures 2.30 m/s.  5. The inferior vena cava is normal in size with greater than 50% respiratory variability, suggesting right atrial pressure of 3 mmHg. FINDINGS  Left Ventricle: Mild intracavitary gradient. Peak velocity 1.3 m/s. Peak gradient 7 mmHg. With Valsalva peak velocity increases to 1.7 m/s and peak gradient 12 mmHg. Left ventricular ejection fraction, by estimation, is 70 to 75%. The left ventricle has  hyperdynamic function. The left ventricle has no regional wall motion abnormalities. The left ventricular internal cavity size was normal in size. There is no left ventricular hypertrophy. Left ventricular diastolic parameters are consistent with Grade I diastolic dysfunction (impaired relaxation). Right Ventricle: The right ventricular size is normal. No increase in right ventricular wall thickness. Right ventricular systolic function is normal. There is normal pulmonary artery systolic pressure. The tricuspid regurgitant velocity is 2.21 m/s, and  with an assumed right atrial pressure of 3 mmHg, the estimated right ventricular systolic pressure is 22.5 mmHg. Left Atrium: Left atrial size was normal in size. Right Atrium: Right atrial size was normal in size.  Pericardium: There is no evidence of pericardial effusion. Mitral Valve: Mobile mitral annular calcification. The mitral valve is degenerative in appearance. Mild mitral annular calcification. No evidence of mitral valve regurgitation. No evidence of mitral valve stenosis. MV peak gradient, 8.1 mmHg. The mean mitral valve gradient is 3.0 mmHg. Tricuspid Valve: The tricuspid valve is normal in structure. Tricuspid valve regurgitation is trivial. No evidence of tricuspid stenosis. Aortic Valve: The aortic valve is tricuspid. There is mild calcification of the aortic valve. There is mild thickening of the aortic valve. Aortic valve regurgitation is not visualized. Mild aortic stenosis is present. Aortic valve mean gradient measures  11.0 mmHg. Aortic valve peak gradient measures 21.2 mmHg. Aortic valve area, by VTI measures 1.16 cm. Pulmonic Valve: The pulmonic valve was normal in structure. Pulmonic valve regurgitation is not visualized. No evidence of pulmonic stenosis. Aorta: The aortic root is normal in size and structure. Venous: The inferior vena cava is normal in size with greater than 50% respiratory variability, suggesting right atrial pressure of 3 mmHg. IAS/Shunts: No atrial level shunt detected by color flow Doppler.  LEFT VENTRICLE PLAX 2D LVIDd:         3.70 cm     Diastology LVIDs:         2.00 cm     LV e' medial:    4.57 cm/s LV PW:         1.30 cm     LV E/e' medial:  29.1 LV IVS:        1.00 cm     LV e' lateral:   9.57 cm/s LVOT diam:     1.70 cm     LV E/e' lateral: 13.9 LV SV:         61 LV SV Index:   37 LVOT Area:     2.27 cm  LV Volumes (MOD) LV vol d, MOD A2C: 51.1 ml LV vol d, MOD A4C: 48.1 ml LV vol s, MOD A2C: 14.8 ml LV vol s, MOD A4C: 14.9 ml  LV SV MOD A2C:     36.3 ml LV SV MOD A4C:     48.1 ml LV SV MOD BP:      35.2 ml RIGHT VENTRICLE RV S prime:     12.30 cm/s TAPSE (M-mode): 2.4 cm LEFT ATRIUM             Index LA Vol (A2C):   47.9 ml 28.89 ml/m LA Vol (A4C):   43.1 ml 25.99 ml/m  LA Biplane Vol: 46.6 ml 28.10 ml/m  AORTIC VALVE AV Area (Vmax):    1.13 cm AV Area (Vmean):   1.09 cm AV Area (VTI):     1.16 cm AV Vmax:           230.00 cm/s AV Vmean:          159.000 cm/s AV VTI:            0.530 m AV Peak Grad:      21.2 mmHg AV Mean Grad:      11.0 mmHg LVOT Vmax:         114.00 cm/s LVOT Vmean:        76.100 cm/s LVOT VTI:          0.270 m LVOT/AV VTI ratio: 0.51  AORTA Ao Asc diam: 3.70 cm MITRAL VALVE                TRICUSPID VALVE MV Area (PHT): 3.06 cm     TR Peak grad:   19.5 mmHg MV Area VTI:   1.58 cm     TR Vmax:        221.00 cm/s MV Peak grad:  8.1 mmHg MV Mean grad:  3.0 mmHg     SHUNTS MV Vmax:       1.42 m/s     Systemic VTI:  0.27 m MV Vmean:      72.4 cm/s    Systemic Diam: 1.70 cm MV Decel Time: 248 msec MV E velocity: 133.00 cm/s MV A velocity: 132.00 cm/s MV E/A ratio:  1.01 Maudine Sos MD Electronically signed by Maudine Sos MD Signature Date/Time: 12/04/2023/12:42:41 PM    Final    CT Angio Chest PE W and/or Wo Contrast Result Date: 12/02/2023 CLINICAL DATA:  Dyspnea on exertion, hypoxia, left chest pain EXAM: CT ANGIOGRAPHY CHEST WITH CONTRAST TECHNIQUE: Multidetector CT imaging of the chest was performed using the standard protocol during bolus administration of intravenous contrast. Multiplanar CT image reconstructions and MIPs were obtained to evaluate the vascular anatomy. RADIATION DOSE REDUCTION: This exam was performed according to the departmental dose-optimization program which includes automated exposure control, adjustment of the mA and/or kV according to patient size and/or use of iterative reconstruction technique. CONTRAST:  75mL OMNIPAQUE  IOHEXOL  350 MG/ML SOLN COMPARISON:  12/02/2023 FINDINGS: Cardiovascular: This is a technically adequate evaluation of the pulmonary vasculature. Large left-sided pulmonary emboli are identified, within the left main pulmonary artery as well as lobar and segmental branches of the left upper and left lower  lobe. There is moderate clot burden. No evidence of right heart strain, with RV/LV ratio measuring 0.89. Incidental note is made of a left-sided superior vena cava emptying via the coronary sinus. Heart is unremarkable without pericardial effusion. Dense calcifications of the aortic valve. Ectasia of the ascending thoracic aorta measuring up to 3.8 cm, without frank aneurysm. No evidence of dissection. Atherosclerosis of the aorta and coronary vasculature. There is significant atheromatous plaque at the origin of the left subclavian artery, stenosis estimated at  50-70%. Mediastinum/Nodes: No enlarged mediastinal, hilar, or axillary lymph nodes. Thyroid  gland, trachea, and esophagus demonstrate no significant findings. Lungs/Pleura: Basilar predominant interlobular septal thickening and fibrosis are noted, consistent with chronic interstitial lung disease. Wedge-shaped ground-glass consolidation superior segment left upper lobe may reflect developing pulmonary infarct. No acute airspace disease, effusion, or pneumothorax. Central airways are patent. Upper Abdomen: No acute abnormality. Musculoskeletal: No acute or destructive bony abnormalities. Reconstructed images demonstrate no additional findings. Review of the MIP images confirms the above findings. IMPRESSION: 1. Left-sided central, lobar, and segmental pulmonary emboli. Moderate clot burden with no evidence of right heart strain. 2. Left-sided SVC emptying via the coronary sinus, and anatomic variant. 3. Basilar predominant interstitial scarring and mild fibrosis. 4. Wedge-shaped ground-glass consolidation superior segment left upper lobe, favor developing infarct given left-sided pulmonary emboli. 5. Aortic Atherosclerosis (ICD10-I70.0). Coronary artery atherosclerosis. Critical Value/emergent results were called by telephone at the time of interpretation on 12/02/2023 at 12:55 pm to provider [facility] , who verbally acknowledged these results.  Electronically Signed   By: Bobbye Burrow M.D.   On: 12/02/2023 12:55   DG Chest 2 View Result Date: 12/02/2023 CLINICAL DATA:  Shortness of breath EXAM: CHEST - 2 VIEW COMPARISON:  Chest x-ray performed August 15, 2017 FINDINGS: Mild diffuse interstitial prominence. Heart mediastinum are not significantly changed. No pleural effusion or focal lobar infiltrate. IMPRESSION: 1. No pleural effusion or focal lobar infiltrate. 2. Chronic appearing interstitial prominence, possibly sequelae of underlying chronic lung disease. Electronically Signed   By: Reagan Camera M.D.   On: 12/02/2023 11:23       The results of significant diagnostics from this hospitalization (including imaging, microbiology, ancillary and laboratory) are listed below for reference.     Microbiology: Recent Results (from the past 240 hours)  Resp panel by RT-PCR (RSV, Flu A&B, Covid) Anterior Nasal Swab     Status: None   Collection Time: 12/02/23 11:15 AM   Specimen: Anterior Nasal Swab  Result Value Ref Range Status   SARS Coronavirus 2 by RT PCR NEGATIVE NEGATIVE Final    Comment: (NOTE) SARS-CoV-2 target nucleic acids are NOT DETECTED.  The SARS-CoV-2 RNA is generally detectable in upper respiratory specimens during the acute phase of infection. The lowest concentration of SARS-CoV-2 viral copies this assay can detect is 138 copies/mL. A negative result does not preclude SARS-Cov-2 infection and should not be used as the sole basis for treatment or other patient management decisions. A negative result may occur with  improper specimen collection/handling, submission of specimen other than nasopharyngeal swab, presence of viral mutation(s) within the areas targeted by this assay, and inadequate number of viral copies(<138 copies/mL). A negative result must be combined with clinical observations, patient history, and epidemiological information. The expected result is Negative.  Fact Sheet for Patients:   BloggerCourse.com  Fact Sheet for Healthcare Providers:  SeriousBroker.it  This test is no t yet approved or cleared by the [place]  FDA and  has been authorized for detection and/or diagnosis of SARS-CoV-2 by FDA under an Emergency Use Authorization (EUA). This EUA will remain  in effect (meaning this test can be used) for the duration of the COVID-19 declaration under Section 564(b)(1) of the Act, 21 U.S.C.section 360bbb-3(b)(1), unless the authorization is terminated  or revoked sooner.       Influenza A by PCR NEGATIVE NEGATIVE Final   Influenza B by PCR NEGATIVE NEGATIVE Final    Comment: (NOTE) The Xpert Xpress SARS-CoV-2/FLU/RSV plus assay is intended as  an aid in the diagnosis of influenza from Nasopharyngeal swab specimens and should not be used as a sole basis for treatment. Nasal washings and aspirates are unacceptable for Xpert Xpress SARS-CoV-2/FLU/RSV testing.  Fact Sheet for Patients: BloggerCourse.com  Fact Sheet for Healthcare Providers: SeriousBroker.it  This test is not yet approved or cleared by the [place]  FDA and has been authorized for detection and/or diagnosis of SARS-CoV-2 by FDA under an Emergency Use Authorization (EUA). This EUA will remain in effect (meaning this test can be used) for the duration of the COVID-19 declaration under Section 564(b)(1) of the Act, 21 U.S.C. section 360bbb-3(b)(1), unless the authorization is terminated or revoked.     Resp Syncytial Virus by PCR NEGATIVE NEGATIVE Final    Comment: (NOTE) Fact Sheet for Patients: BloggerCourse.com  Fact Sheet for Healthcare Providers: SeriousBroker.it  This test is not yet approved or cleared by the [place]  FDA and has been authorized for detection and/or diagnosis of SARS-CoV-2 by FDA under an Emergency Use  Authorization (EUA). This EUA will remain in effect (meaning this test can be used) for the duration of the COVID-19 declaration under Section 564(b)(1) of the Act, 21 U.S.C. section 360bbb-3(b)(1), unless the authorization is terminated or revoked.  Performed at [facility], [address], [place], [place] [address]      Labs:  CBC: Recent Labs  Lab 12/02/23 1115 12/03/23 0911 12/04/23 0537  WBC 10.0 7.1 8.5  NEUTROABS 6.5  --   --   HGB 13.5 13.3 13.5  HCT 40.3 40.8 40.9  MCV 86.9 89.5 89.3  PLT 284 276 309   BMP &GFR Recent Labs  Lab 12/02/23 1115 12/03/23 0911 12/04/23 0537  NA 136 137 138  K 4.1 4.0 4.1  CL 97* 100 99  CO2 26 31 29   GLUCOSE 115* 151* 109*  BUN 17 12 13   CREATININE 0.74 0.83 0.70  CALCIUM 10.6* 9.3 9.6  MG  --  2.1 2.0  PHOS  --   --  3.2   Estimated Creatinine Clearance: 38.5 mL/min (by C-G formula based on SCr of 0.7 mg/dL). Liver & Pancreas: Recent Labs  Lab 12/02/23 1115 12/04/23 0537  AST 18  --   ALT 7  --   ALKPHOS 81  --   BILITOT 0.4  --   PROT 8.5*  --   ALBUMIN 4.1 3.3*   No results for input(s): LIPASE, AMYLASE in the last 168 hours. No results for input(s): AMMONIA in the last 168 hours. Diabetic: Recent Labs    12/03/23 1142  HGBA1C 6.4*   No results for input(s): GLUCAP in the last 168 hours. Cardiac Enzymes: No results for input(s): CKTOTAL, CKMB, CKMBINDEX, TROPONINI in the last 168 hours. Recent Labs    12/02/23 1115  PROBNP 267.0   Coagulation Profile: No results for input(s): INR, PROTIME in the last 168 hours. Thyroid  Function Tests: No results for input(s): TSH, T4TOTAL, FREET4, T3FREE, THYROIDAB in the last 72 hours. Lipid Profile: No results for input(s): CHOL, HDL, LDLCALC, TRIG, CHOLHDL, LDLDIRECT in the last 72 hours. Anemia Panel: No results for input(s): VITAMINB12, FOLATE, FERRITIN, TIBC, IRON, RETICCTPCT in the  last 72 hours. Urine analysis:    Component Value Date/Time   COLORURINE YELLOW 04/06/2013 1750   APPEARANCEUR CLEAR 04/06/2013 1750   LABSPEC 1.016 04/06/2013 1750   PHURINE 5.5 04/06/2013 1750   GLUCOSEU NEGATIVE 04/06/2013 1750   HGBUR NEGATIVE 04/06/2013 1750   BILIRUBINUR NEGATIVE 04/06/2013 1750   KETONESUR NEGATIVE  04/06/2013 1750   PROTEINUR NEGATIVE 04/06/2013 1750   UROBILINOGEN 0.2 04/06/2013 1750   NITRITE NEGATIVE 04/06/2013 1750   LEUKOCYTESUR NEGATIVE 04/06/2013 1750   Sepsis Labs: Invalid input(s): PROCALCITONIN, LACTICIDVEN   SIGNED:  Adamarys Shall T Makaylah Oddo, MD  [facility] 12/04/2023, 6:03 PM

## 2023-12-12 DIAGNOSIS — I10 Essential (primary) hypertension: Secondary | ICD-10-CM | POA: Diagnosis not present

## 2023-12-12 DIAGNOSIS — E559 Vitamin D deficiency, unspecified: Secondary | ICD-10-CM | POA: Diagnosis not present

## 2023-12-12 DIAGNOSIS — E7800 Pure hypercholesterolemia, unspecified: Secondary | ICD-10-CM | POA: Diagnosis not present

## 2023-12-12 DIAGNOSIS — I2699 Other pulmonary embolism without acute cor pulmonale: Secondary | ICD-10-CM | POA: Diagnosis not present

## 2023-12-12 DIAGNOSIS — R7303 Prediabetes: Secondary | ICD-10-CM | POA: Diagnosis not present

## 2023-12-21 DIAGNOSIS — I10 Essential (primary) hypertension: Secondary | ICD-10-CM | POA: Diagnosis not present

## 2023-12-21 DIAGNOSIS — E559 Vitamin D deficiency, unspecified: Secondary | ICD-10-CM | POA: Diagnosis not present

## 2023-12-21 DIAGNOSIS — F324 Major depressive disorder, single episode, in partial remission: Secondary | ICD-10-CM | POA: Diagnosis not present

## 2023-12-21 DIAGNOSIS — E1169 Type 2 diabetes mellitus with other specified complication: Secondary | ICD-10-CM | POA: Diagnosis not present

## 2023-12-21 DIAGNOSIS — F411 Generalized anxiety disorder: Secondary | ICD-10-CM | POA: Diagnosis not present

## 2023-12-21 DIAGNOSIS — E7800 Pure hypercholesterolemia, unspecified: Secondary | ICD-10-CM | POA: Diagnosis not present

## 2023-12-21 DIAGNOSIS — Z6825 Body mass index (BMI) 25.0-25.9, adult: Secondary | ICD-10-CM | POA: Diagnosis not present

## 2024-01-05 ENCOUNTER — Encounter: Payer: Self-pay | Admitting: Advanced Practice Midwife
